# Patient Record
Sex: Female | Born: 1940 | Race: White | Hispanic: No | Smoking: Never smoker
Health system: Southern US, Community
[De-identification: ages and names within clinical notes are randomized; demographics above are authoritative.]

## PROBLEM LIST (undated history)

## (undated) DIAGNOSIS — I10 Essential (primary) hypertension: Secondary | ICD-10-CM

## (undated) DIAGNOSIS — I739 Peripheral vascular disease, unspecified: Secondary | ICD-10-CM

## (undated) DIAGNOSIS — I2510 Atherosclerotic heart disease of native coronary artery without angina pectoris: Secondary | ICD-10-CM

## (undated) DIAGNOSIS — K759 Inflammatory liver disease, unspecified: Secondary | ICD-10-CM

## (undated) DIAGNOSIS — E785 Hyperlipidemia, unspecified: Secondary | ICD-10-CM

## (undated) DIAGNOSIS — N189 Chronic kidney disease, unspecified: Secondary | ICD-10-CM

## (undated) DIAGNOSIS — M1990 Unspecified osteoarthritis, unspecified site: Secondary | ICD-10-CM

## (undated) DIAGNOSIS — I81 Portal vein thrombosis: Secondary | ICD-10-CM

## (undated) DIAGNOSIS — R319 Hematuria, unspecified: Secondary | ICD-10-CM

## (undated) DIAGNOSIS — N200 Calculus of kidney: Secondary | ICD-10-CM

## (undated) DIAGNOSIS — K219 Gastro-esophageal reflux disease without esophagitis: Secondary | ICD-10-CM

## (undated) DIAGNOSIS — N952 Postmenopausal atrophic vaginitis: Secondary | ICD-10-CM

## (undated) HISTORY — DX: Calculus of kidney: N20.0

## (undated) HISTORY — PX: HEEL SPUR SURGERY: SHX665

## (undated) HISTORY — PX: LIP REPAIR: SHX440

## (undated) HISTORY — DX: Gastro-esophageal reflux disease without esophagitis: K21.9

## (undated) HISTORY — DX: Portal vein thrombosis: I81

## (undated) HISTORY — DX: Hematuria, unspecified: R31.9

## (undated) HISTORY — DX: Postmenopausal atrophic vaginitis: N95.2

## (undated) HISTORY — PX: TONSILLECTOMY: SUR1361

## (undated) HISTORY — PX: CERVICAL SPINE SURGERY: SHX589

---

## 2004-12-27 ENCOUNTER — Ambulatory Visit: Payer: Self-pay | Admitting: Internal Medicine

## 2005-01-02 ENCOUNTER — Ambulatory Visit: Payer: Self-pay | Admitting: Internal Medicine

## 2005-08-05 ENCOUNTER — Emergency Department: Payer: Self-pay | Admitting: Emergency Medicine

## 2005-08-10 ENCOUNTER — Inpatient Hospital Stay: Payer: Self-pay | Admitting: Internal Medicine

## 2005-08-10 ENCOUNTER — Ambulatory Visit: Payer: Self-pay | Admitting: Internal Medicine

## 2005-09-04 ENCOUNTER — Ambulatory Visit: Payer: Self-pay | Admitting: Internal Medicine

## 2006-01-16 ENCOUNTER — Other Ambulatory Visit: Payer: Self-pay

## 2006-01-16 ENCOUNTER — Emergency Department: Payer: Self-pay | Admitting: Emergency Medicine

## 2006-01-17 ENCOUNTER — Ambulatory Visit: Payer: Self-pay | Admitting: Emergency Medicine

## 2006-01-29 ENCOUNTER — Ambulatory Visit: Payer: Self-pay | Admitting: Internal Medicine

## 2006-01-30 ENCOUNTER — Ambulatory Visit: Payer: Self-pay | Admitting: Internal Medicine

## 2006-02-08 ENCOUNTER — Ambulatory Visit: Payer: Self-pay

## 2006-02-12 ENCOUNTER — Ambulatory Visit: Payer: Self-pay

## 2007-01-08 ENCOUNTER — Ambulatory Visit: Payer: Self-pay | Admitting: Internal Medicine

## 2007-01-30 ENCOUNTER — Ambulatory Visit: Payer: Self-pay | Admitting: Internal Medicine

## 2007-02-04 ENCOUNTER — Ambulatory Visit: Payer: Self-pay | Admitting: Internal Medicine

## 2008-02-27 ENCOUNTER — Ambulatory Visit: Payer: Self-pay | Admitting: Internal Medicine

## 2008-04-13 ENCOUNTER — Ambulatory Visit: Payer: Self-pay | Admitting: Internal Medicine

## 2009-01-13 ENCOUNTER — Ambulatory Visit: Payer: Self-pay | Admitting: Internal Medicine

## 2009-01-26 ENCOUNTER — Ambulatory Visit: Payer: Self-pay | Admitting: Ophthalmology

## 2009-02-28 ENCOUNTER — Ambulatory Visit: Payer: Self-pay | Admitting: Internal Medicine

## 2009-06-10 ENCOUNTER — Ambulatory Visit: Payer: Self-pay | Admitting: Neurology

## 2010-03-01 ENCOUNTER — Ambulatory Visit: Payer: Self-pay | Admitting: Internal Medicine

## 2010-10-23 ENCOUNTER — Ambulatory Visit: Payer: Self-pay | Admitting: Internal Medicine

## 2011-03-05 ENCOUNTER — Ambulatory Visit: Payer: Self-pay | Admitting: Internal Medicine

## 2011-05-23 ENCOUNTER — Encounter: Payer: Self-pay | Admitting: Unknown Physician Specialty

## 2011-06-13 ENCOUNTER — Encounter: Payer: Self-pay | Admitting: Unknown Physician Specialty

## 2012-03-05 ENCOUNTER — Ambulatory Visit: Payer: Self-pay | Admitting: Internal Medicine

## 2012-03-08 ENCOUNTER — Emergency Department: Payer: Self-pay | Admitting: Internal Medicine

## 2012-03-08 LAB — URINALYSIS, COMPLETE
Bilirubin,UR: NEGATIVE
Ketone: NEGATIVE
Leukocyte Esterase: NEGATIVE
Nitrite: NEGATIVE
Ph: 9 (ref 4.5–8.0)
Protein: NEGATIVE
Specific Gravity: 1.005 (ref 1.003–1.030)
WBC UR: <1 /HPF (ref 0–5)

## 2012-03-08 LAB — CBC
HCT: 38.3 % (ref 35.0–47.0)
MCH: 32 pg (ref 26.0–34.0)
MCV: 93 fL (ref 80–100)
RBC: 4.11 10*6/uL (ref 3.80–5.20)
RDW: 13.6 % (ref 11.5–14.5)
WBC: 4 10*3/uL (ref 3.6–11.0)

## 2012-03-08 LAB — COMPREHENSIVE METABOLIC PANEL
Alkaline Phosphatase: 64 U/L (ref 50–136)
Bilirubin,Total: 0.5 mg/dL (ref 0.2–1.0)
Calcium, Total: 9.7 mg/dL (ref 8.5–10.1)
Chloride: 99 mmol/L (ref 98–107)
Creatinine: 0.93 mg/dL (ref 0.60–1.30)
EGFR (African American): >60
EGFR (Non-African Amer.): >60
Glucose: 92 mg/dL (ref 65–99)
Osmolality: 270 (ref 275–301)
Potassium: 3.8 mmol/L (ref 3.5–5.1)
SGOT(AST): 23 U/L (ref 15–37)

## 2012-03-12 ENCOUNTER — Ambulatory Visit: Payer: Self-pay | Admitting: Internal Medicine

## 2012-12-13 ENCOUNTER — Emergency Department: Payer: Self-pay | Admitting: Emergency Medicine

## 2012-12-13 LAB — URINALYSIS, COMPLETE
Bilirubin,UR: NEGATIVE
Glucose,UR: NEGATIVE mg/dL (ref 0–75)
Ketone: NEGATIVE
Protein: NEGATIVE
Specific Gravity: 1.003 (ref 1.003–1.030)
Squamous Epithelial: 1
WBC UR: 10 /HPF (ref 0–5)

## 2012-12-13 LAB — CBC
HCT: 34.4 % — ABNORMAL LOW (ref 35.0–47.0)
MCHC: 34 g/dL (ref 32.0–36.0)
MCV: 94 fL (ref 80–100)
RDW: 14 % (ref 11.5–14.5)

## 2012-12-13 LAB — BASIC METABOLIC PANEL
BUN: 14 mg/dL (ref 7–18)
Calcium, Total: 8.7 mg/dL (ref 8.5–10.1)
Creatinine: 1.19 mg/dL (ref 0.60–1.30)
EGFR (African American): 53 — ABNORMAL LOW
Osmolality: 270 (ref 275–301)

## 2012-12-15 LAB — URINE CULTURE

## 2012-12-19 ENCOUNTER — Ambulatory Visit: Payer: Self-pay | Admitting: Internal Medicine

## 2013-03-06 ENCOUNTER — Ambulatory Visit: Payer: Self-pay | Admitting: Internal Medicine

## 2013-03-18 ENCOUNTER — Ambulatory Visit: Payer: Self-pay | Admitting: Internal Medicine

## 2013-09-02 ENCOUNTER — Ambulatory Visit: Payer: Self-pay | Admitting: Internal Medicine

## 2013-10-09 ENCOUNTER — Other Ambulatory Visit: Payer: Self-pay | Admitting: Internal Medicine

## 2013-10-09 LAB — PROTIME-INR: INR: 1.5

## 2014-03-08 ENCOUNTER — Ambulatory Visit: Payer: Self-pay | Admitting: Internal Medicine

## 2014-03-15 DIAGNOSIS — Z7901 Long term (current) use of anticoagulants: Secondary | ICD-10-CM | POA: Insufficient documentation

## 2014-07-09 ENCOUNTER — Ambulatory Visit: Payer: Self-pay | Admitting: Internal Medicine

## 2014-07-29 ENCOUNTER — Ambulatory Visit: Payer: Self-pay | Admitting: Internal Medicine

## 2014-09-30 ENCOUNTER — Emergency Department: Payer: Self-pay | Admitting: Emergency Medicine

## 2014-09-30 LAB — CBC WITH DIFFERENTIAL/PLATELET
Basophil #: 0 10*3/uL (ref 0.0–0.1)
Basophil %: 0.5 %
Eosinophil #: 0.2 10*3/uL (ref 0.0–0.7)
Eosinophil %: 3.5 %
HCT: 32.8 % — ABNORMAL LOW (ref 35.0–47.0)
HGB: 11.2 g/dL — AB (ref 12.0–16.0)
Lymphocyte #: 1.3 10*3/uL (ref 1.0–3.6)
Lymphocyte %: 26 %
MCH: 32.9 pg (ref 26.0–34.0)
MCHC: 34.3 g/dL (ref 32.0–36.0)
MCV: 96 fL (ref 80–100)
Monocyte #: 0.5 x10 3/mm (ref 0.2–0.9)
Monocyte %: 9.8 %
NEUTROS ABS: 3 10*3/uL (ref 1.4–6.5)
Neutrophil %: 60.2 %
PLATELETS: 213 10*3/uL (ref 150–440)
RBC: 3.41 10*6/uL — AB (ref 3.80–5.20)
RDW: 14.1 % (ref 11.5–14.5)
WBC: 5.1 10*3/uL (ref 3.6–11.0)

## 2014-09-30 LAB — COMPREHENSIVE METABOLIC PANEL
ALK PHOS: 66 U/L
Albumin: 3.8 g/dL (ref 3.4–5.0)
Anion Gap: 8 (ref 7–16)
BUN: 15 mg/dL (ref 7–18)
Bilirubin,Total: 0.5 mg/dL (ref 0.2–1.0)
CALCIUM: 8 mg/dL — AB (ref 8.5–10.1)
CO2: 27 mmol/L (ref 21–32)
Chloride: 95 mmol/L — ABNORMAL LOW (ref 98–107)
Creatinine: 1.33 mg/dL — ABNORMAL HIGH (ref 0.60–1.30)
EGFR (African American): 50 — ABNORMAL LOW
GFR CALC NON AF AMER: 42 — AB
GLUCOSE: 97 mg/dL (ref 65–99)
Osmolality: 262 (ref 275–301)
POTASSIUM: 3.8 mmol/L (ref 3.5–5.1)
SGOT(AST): 31 U/L (ref 15–37)
SGPT (ALT): 31 U/L
Sodium: 130 mmol/L — ABNORMAL LOW (ref 136–145)
Total Protein: 6.5 g/dL (ref 6.4–8.2)

## 2014-09-30 LAB — URINALYSIS, COMPLETE
BACTERIA: NONE SEEN
Bilirubin,UR: NEGATIVE
GLUCOSE, UR: NEGATIVE mg/dL (ref 0–75)
Ketone: NEGATIVE
NITRITE: NEGATIVE
Ph: 6 (ref 4.5–8.0)
Protein: NEGATIVE
RBC,UR: 1 /HPF (ref 0–5)
Specific Gravity: 1.003 (ref 1.003–1.030)
WBC UR: 17 /HPF (ref 0–5)

## 2014-09-30 LAB — PROTIME-INR
INR: 2
Prothrombin Time: 22.6 secs — ABNORMAL HIGH (ref 11.5–14.7)

## 2014-09-30 LAB — TROPONIN I

## 2014-10-01 ENCOUNTER — Emergency Department: Payer: Self-pay | Admitting: Emergency Medicine

## 2014-10-01 LAB — COMPREHENSIVE METABOLIC PANEL
ALK PHOS: 70 U/L
Albumin: 3.9 g/dL (ref 3.4–5.0)
Anion Gap: 9 (ref 7–16)
BUN: 13 mg/dL (ref 7–18)
Bilirubin,Total: 1.3 mg/dL — ABNORMAL HIGH (ref 0.2–1.0)
Calcium, Total: 8.5 mg/dL (ref 8.5–10.1)
Chloride: 104 mmol/L (ref 98–107)
Co2: 24 mmol/L (ref 21–32)
Creatinine: 1.22 mg/dL (ref 0.60–1.30)
GFR CALC AF AMER: 56 — AB
GFR CALC NON AF AMER: 46 — AB
Glucose: 93 mg/dL (ref 65–99)
Osmolality: 274 (ref 275–301)
POTASSIUM: 3.6 mmol/L (ref 3.5–5.1)
SGOT(AST): 29 U/L (ref 15–37)
SGPT (ALT): 30 U/L
Sodium: 137 mmol/L (ref 136–145)
Total Protein: 6.8 g/dL (ref 6.4–8.2)

## 2014-10-01 LAB — URINALYSIS, COMPLETE
BACTERIA: NONE SEEN
BILIRUBIN, UR: NEGATIVE
Glucose,UR: NEGATIVE mg/dL (ref 0–75)
KETONE: NEGATIVE
Nitrite: NEGATIVE
PH: 8 (ref 4.5–8.0)
PROTEIN: NEGATIVE
RBC,UR: 3 /HPF (ref 0–5)
Specific Gravity: 1.003 (ref 1.003–1.030)
Squamous Epithelial: 1

## 2014-10-01 LAB — CBC
HCT: 35.1 % (ref 35.0–47.0)
HGB: 11.8 g/dL — ABNORMAL LOW (ref 12.0–16.0)
MCH: 32.4 pg (ref 26.0–34.0)
MCHC: 33.6 g/dL (ref 32.0–36.0)
MCV: 97 fL (ref 80–100)
PLATELETS: 214 10*3/uL (ref 150–440)
RBC: 3.63 10*6/uL — ABNORMAL LOW (ref 3.80–5.20)
RDW: 14.1 % (ref 11.5–14.5)
WBC: 3.7 10*3/uL (ref 3.6–11.0)

## 2014-10-01 LAB — LIPASE, BLOOD: Lipase: 118 U/L (ref 73–393)

## 2014-11-24 ENCOUNTER — Ambulatory Visit: Payer: Self-pay | Admitting: Urology

## 2014-12-17 DIAGNOSIS — I10 Essential (primary) hypertension: Secondary | ICD-10-CM | POA: Insufficient documentation

## 2014-12-17 DIAGNOSIS — R079 Chest pain, unspecified: Secondary | ICD-10-CM | POA: Insufficient documentation

## 2014-12-17 DIAGNOSIS — R0681 Apnea, not elsewhere classified: Secondary | ICD-10-CM | POA: Insufficient documentation

## 2014-12-19 DIAGNOSIS — I6529 Occlusion and stenosis of unspecified carotid artery: Secondary | ICD-10-CM | POA: Insufficient documentation

## 2014-12-19 DIAGNOSIS — I2510 Atherosclerotic heart disease of native coronary artery without angina pectoris: Secondary | ICD-10-CM | POA: Insufficient documentation

## 2015-02-25 ENCOUNTER — Other Ambulatory Visit: Payer: Self-pay | Admitting: Internal Medicine

## 2015-02-25 DIAGNOSIS — Z139 Encounter for screening, unspecified: Secondary | ICD-10-CM

## 2015-03-15 ENCOUNTER — Other Ambulatory Visit: Payer: Self-pay | Admitting: Internal Medicine

## 2015-03-15 ENCOUNTER — Ambulatory Visit
Admission: RE | Admit: 2015-03-15 | Discharge: 2015-03-15 | Disposition: A | Discharge status: Home / Self Care | Payer: Medicare Other | Source: Ambulatory Visit | Attending: Internal Medicine | Admitting: Internal Medicine

## 2015-03-15 DIAGNOSIS — Z139 Encounter for screening, unspecified: Secondary | ICD-10-CM

## 2015-03-15 DIAGNOSIS — Z1231 Encounter for screening mammogram for malignant neoplasm of breast: Secondary | ICD-10-CM | POA: Diagnosis not present

## 2015-06-27 ENCOUNTER — Other Ambulatory Visit: Payer: Self-pay

## 2015-06-27 DIAGNOSIS — R310 Gross hematuria: Secondary | ICD-10-CM

## 2015-10-11 ENCOUNTER — Encounter: Payer: Self-pay | Admitting: *Deleted

## 2015-10-12 ENCOUNTER — Encounter: Payer: Self-pay | Admitting: *Deleted

## 2015-10-12 ENCOUNTER — Ambulatory Visit
Admission: RE | Admit: 2015-10-12 | Discharge: 2015-10-12 | Disposition: A | Discharge status: Home / Self Care | Payer: Medicare Other | Source: Ambulatory Visit | Attending: Unknown Physician Specialty | Admitting: Unknown Physician Specialty

## 2015-10-12 ENCOUNTER — Ambulatory Visit: Payer: Medicare Other | Admitting: Anesthesiology

## 2015-10-12 ENCOUNTER — Encounter
Admission: RE | Disposition: A | Discharge status: Home / Self Care | Payer: Self-pay | Source: Ambulatory Visit | Attending: Unknown Physician Specialty

## 2015-10-12 DIAGNOSIS — Z79899 Other long term (current) drug therapy: Secondary | ICD-10-CM | POA: Diagnosis not present

## 2015-10-12 DIAGNOSIS — M1990 Unspecified osteoarthritis, unspecified site: Secondary | ICD-10-CM | POA: Diagnosis not present

## 2015-10-12 DIAGNOSIS — K640 First degree hemorrhoids: Secondary | ICD-10-CM | POA: Insufficient documentation

## 2015-10-12 DIAGNOSIS — Z1211 Encounter for screening for malignant neoplasm of colon: Secondary | ICD-10-CM | POA: Diagnosis not present

## 2015-10-12 DIAGNOSIS — Z880 Allergy status to penicillin: Secondary | ICD-10-CM | POA: Diagnosis not present

## 2015-10-12 DIAGNOSIS — Z7951 Long term (current) use of inhaled steroids: Secondary | ICD-10-CM | POA: Insufficient documentation

## 2015-10-12 DIAGNOSIS — I739 Peripheral vascular disease, unspecified: Secondary | ICD-10-CM | POA: Diagnosis not present

## 2015-10-12 DIAGNOSIS — Z7901 Long term (current) use of anticoagulants: Secondary | ICD-10-CM | POA: Insufficient documentation

## 2015-10-12 DIAGNOSIS — I1310 Hypertensive heart and chronic kidney disease without heart failure, with stage 1 through stage 4 chronic kidney disease, or unspecified chronic kidney disease: Secondary | ICD-10-CM | POA: Insufficient documentation

## 2015-10-12 DIAGNOSIS — Z888 Allergy status to other drugs, medicaments and biological substances status: Secondary | ICD-10-CM | POA: Diagnosis not present

## 2015-10-12 DIAGNOSIS — E785 Hyperlipidemia, unspecified: Secondary | ICD-10-CM | POA: Diagnosis not present

## 2015-10-12 DIAGNOSIS — I2510 Atherosclerotic heart disease of native coronary artery without angina pectoris: Secondary | ICD-10-CM | POA: Insufficient documentation

## 2015-10-12 DIAGNOSIS — N189 Chronic kidney disease, unspecified: Secondary | ICD-10-CM | POA: Insufficient documentation

## 2015-10-12 DIAGNOSIS — Z9889 Other specified postprocedural states: Secondary | ICD-10-CM | POA: Diagnosis not present

## 2015-10-12 HISTORY — DX: Chronic kidney disease, unspecified: N18.9

## 2015-10-12 HISTORY — DX: Atherosclerotic heart disease of native coronary artery without angina pectoris: I25.10

## 2015-10-12 HISTORY — PX: COLONOSCOPY WITH PROPOFOL: SHX5780

## 2015-10-12 HISTORY — DX: Hyperlipidemia, unspecified: E78.5

## 2015-10-12 HISTORY — DX: Peripheral vascular disease, unspecified: I73.9

## 2015-10-12 HISTORY — DX: Inflammatory liver disease, unspecified: K75.9

## 2015-10-12 HISTORY — DX: Unspecified osteoarthritis, unspecified site: M19.90

## 2015-10-12 HISTORY — DX: Essential (primary) hypertension: I10

## 2015-10-12 SURGERY — COLONOSCOPY WITH PROPOFOL
Anesthesia: General

## 2015-10-12 MED ORDER — PROPOFOL 10 MG/ML IV BOLUS
INTRAVENOUS | Status: DC | PRN
  Administered 2015-10-12: 50 mg via INTRAVENOUS

## 2015-10-12 MED ORDER — FENTANYL CITRATE (PF) 100 MCG/2ML IJ SOLN
INTRAMUSCULAR | Status: DC | PRN
  Administered 2015-10-12: 50 ug via INTRAVENOUS

## 2015-10-12 MED ORDER — PROPOFOL 500 MG/50ML IV EMUL
INTRAVENOUS | Status: DC | PRN
  Administered 2015-10-12: 140 ug/kg/min via INTRAVENOUS

## 2015-10-12 MED ORDER — SODIUM CHLORIDE 0.9 % IV SOLN
INTRAVENOUS | Status: DC
Start: 2015-10-12 — End: 2015-10-12
  Administered 2015-10-12: 15:00:00 via INTRAVENOUS

## 2015-10-12 MED ORDER — MIDAZOLAM HCL 5 MG/5ML IJ SOLN
INTRAMUSCULAR | Status: DC | PRN
  Administered 2015-10-12: 1 mg via INTRAVENOUS

## 2015-10-12 MED ORDER — SODIUM CHLORIDE 0.9 % IV SOLN: INTRAVENOUS | Status: DC

## 2015-10-12 NOTE — Anesthesia Preprocedure Evaluation (Signed)
Anesthesia Evaluation  Patient identified by MRN, date of birth, ID band Patient awake    Reviewed: Allergy & Precautions, NPO status , Patient's Chart, lab work & pertinent test results, reviewed documented beta blocker date and time   Airway Mallampati: II  TM Distance: >3 FB     Dental  (+) Chipped   Pulmonary neg pulmonary ROS,    Pulmonary exam normal breath sounds clear to auscultation       Cardiovascular hypertension, Pt. on medications and Pt. on home beta blockers + CAD and + Peripheral Vascular Disease  Normal cardiovascular exam     Neuro/Psych negative neurological ROS  negative psych ROS   GI/Hepatic GERD  Medicated and Controlled,(+) Hepatitis -  Endo/Other  negative endocrine ROS  Renal/GU Renal InsufficiencyRenal disease  negative genitourinary   Musculoskeletal  (+) Arthritis , Osteoarthritis,    Abdominal   Peds negative pediatric ROS (+)  Hematology negative hematology ROS (+)   Anesthesia Other Findings   Reproductive/Obstetrics                             Anesthesia Physical Anesthesia Plan  ASA: III  Anesthesia Plan: General   Post-op Pain Management:    Induction: Intravenous  Airway Management Planned: Nasal Cannula  Additional Equipment:   Intra-op Plan:   Post-operative Plan:   Informed Consent: I have reviewed the patients History and Physical, chart, labs and discussed the procedure including the risks, benefits and alternatives for the proposed anesthesia with the patient or authorized representative who has indicated his/her understanding and acceptance.   Dental advisory given  Plan Discussed with: CRNA and Surgeon  Anesthesia Plan Comments:         Anesthesia Quick Evaluation

## 2015-10-12 NOTE — Anesthesia Postprocedure Evaluation (Incomplete)
Anesthesia Post Note  Patient: Mary Buckley  Procedure(s) Performed: Procedure(s) (LRB): COLONOSCOPY WITH PROPOFOL (N/A)  Anesthesia Post Evaluation  Last Vitals:  Filed Vitals:   10/12/15 1603 10/12/15 1604  BP: 97/51   Pulse: 68   Temp: 36 C 36 C  Resp: 15     Last Pain:  Filed Vitals:   10/12/15 1604  PainSc: 4                  Ashla Murph, RON

## 2015-10-12 NOTE — Anesthesia Procedure Notes (Signed)
Date/Time: 10/12/2015 3:33 PM Performed by: Derinda LateIACONE, Laken Lobato Pre-anesthesia Checklist: Patient being monitored, Timeout performed, Suction available, Emergency Drugs available and Patient identified Patient Re-evaluated:Patient Re-evaluated prior to inductionOxygen Delivery Method: Nasal cannula Preoxygenation: Pre-oxygenation with 100% oxygen Intubation Type: IV induction

## 2015-10-12 NOTE — H&P (Signed)
Primary Care Physician:  Marguarite ArbourSPARKS,JEFFREY D, MD Primary Gastroenterologist:  Dr. Mechele CollinElliott  Pre-Procedure History & Physical: HPI:  Mary Buckley is a 74 y.o. female is here for an colonoscopy.   Past Medical History  Diagnosis Date  . Coronary artery disease   . Hypertension   . Hyperlipidemia   . Arthritis   . Chronic kidney disease   . Peripheral vascular disease (HCC)   . Hepatitis     Past Surgical History  Procedure Laterality Date  . Tonsillectomy    . Heel spur surgery    . Lip repair N/A     Prior to Admission medications   Medication Sig Start Date End Date Taking? Authorizing Provider  acetaminophen (TYLENOL) 325 MG tablet Take 650 mg by mouth every 8 (eight) hours as needed.   Yes Historical Provider, MD  calcium carbonate (TUMS - DOSED IN MG ELEMENTAL CALCIUM) 500 MG chewable tablet Chew 2 tablets by mouth daily.   Yes Historical Provider, MD  celecoxib (CELEBREX) 200 MG capsule Take 200 mg by mouth daily.   Yes Historical Provider, MD  cetirizine (ZYRTEC) 10 MG tablet Take 10 mg by mouth daily as needed for allergies.   Yes Historical Provider, MD  cyanocobalamin 1000 MCG tablet Take 100 mcg by mouth daily.   Yes Historical Provider, MD  cyclobenzaprine (FLEXERIL) 5 MG tablet Take 5 mg by mouth 3 (three) times daily as needed for muscle spasms.   Yes Historical Provider, MD  fluticasone (FLONASE) 50 MCG/ACT nasal spray Place 2 sprays into both nostrils daily.   Yes Historical Provider, MD  loratadine (CLARITIN) 10 MG tablet Take 10 mg by mouth daily.   Yes Historical Provider, MD  nebivolol (BYSTOLIC) 5 MG tablet Take 5 mg by mouth daily.   Yes Historical Provider, MD  omega-3 acid ethyl esters (LOVAZA) 1 G capsule Take 1 g by mouth 2 (two) times daily.   Yes Historical Provider, MD  pantoprazole (PROTONIX) 40 MG tablet Take 40 mg by mouth daily.   Yes Historical Provider, MD  Propylene Glycol 0.6 % SOLN Place 1 drop into both eyes daily.   Yes Historical  Provider, MD  rosuvastatin (CRESTOR) 5 MG tablet Take 5 mg by mouth daily.   Yes Historical Provider, MD  sodium chloride (BRONCHO SALINE) inhaler solution Take 2 sprays by nebulization as needed.   Yes Historical Provider, MD  warfarin (COUMADIN) 2 MG tablet Take 2 mg by mouth daily.   Yes Historical Provider, MD    Allergies as of 09/20/2015 - Review Complete 03/15/2015  Allergen Reaction Noted  . Avelox [moxifloxacin hcl in nacl]  03/15/2015  . Bextra [valdecoxib]  03/15/2015  . Fosamax [alendronate sodium]  03/15/2015  . Guaifenesin & derivatives  03/15/2015  . Lodine [etodolac]  03/15/2015  . Penicillins  03/15/2015  . Prednisone  03/15/2015    History reviewed. No pertinent family history.  Social History   Social History  . Marital Status: Widowed    Spouse Name: N/A  . Number of Children: N/A  . Years of Education: N/A   Occupational History  . Not on file.   Social History Main Topics  . Smoking status: Never Smoker   . Smokeless tobacco: Not on file  . Alcohol Use: No  . Drug Use: No  . Sexual Activity: Not on file   Other Topics Concern  . Not on file   Social History Narrative    Review of Systems: See HPI, otherwise negative ROS  Physical Exam: BP 157/80 mmHg  Pulse 72  Temp(Src) 98.2 F (36.8 C) (Oral)  Resp 20  Ht  (1.626 m)  Wt 68.947 kg (152 lb)  BMI 26.08 kg/m2  SpO2 100% General:   Alert,  pleasant and cooperative in NAD Head:  Normocephalic and atraumatic. Neck:  Supple; no masses or thyromegaly. Lungs:  Clear throughout to auscultation.    Heart:  Regular rate and rhythm. Abdomen:  Soft, nontender and nondistended. Normal bowel sounds, without guarding, and without rebound.   Neurologic:  Alert and  oriented x4;  grossly normal neurologically.  Impression/Plan: Mary Buckley is here for an colonoscopy to be performed for screening  Risks, benefits, limitations, and alternatives regarding  colonoscopy have been reviewed  with the patient.  Questions have been answered.  All parties agreeable.   Lynnae Prude, MD  10/12/2015, 3:32 PM

## 2015-10-12 NOTE — Op Note (Signed)
[facility] Patient Name: Mary Buckley Procedure Date: 10/12/2015 3:34 PM MRN: [id number] Account #: [id number] Date of Birth: 1941/05/02 Admit Type: Outpatient Age: 74 Room: [facility] ENDO ROOM 3 Gender: Female Note Status: Finalized Procedure:         Colonoscopy Indications:       Screening for colorectal malignant neoplasm Providers:         Scot Jun, MD Referring MD:      Duane Lope. Judithann Sheen, MD (Referring MD) Medicines:         Propofol per Anesthesia Complications:     No immediate complications. Procedure:         Pre-Anesthesia Assessment:                    - After reviewing the risks and benefits, the patient was                     deemed in satisfactory condition to undergo the procedure.                    After obtaining informed consent, the colonoscope was                     passed under direct vision. Throughout the procedure, the                     patient's blood pressure, pulse, and oxygen saturations                     were monitored continuously. The Colonoscope was                     introduced through the anus and advanced to the the cecum,                     identified by appendiceal orifice and ileocecal valve. The                     colonoscopy was somewhat difficult due to a tortuous                     colon. Successful completion of the procedure was aided by                     applying abdominal pressure. The patient tolerated the                     procedure well. The quality of the bowel preparation was                     excellent. Findings:      Internal hemorrhoids were found during endoscopy. The hemorrhoids were       small and Grade I (internal hemorrhoids that do not prolapse).      The exam was otherwise without abnormality. Impression:        - Internal hemorrhoids.                    - The examination was otherwise normal.                    - No specimens collected. Recommendation:    -  The findings and recommendations were discussed with the  patient's family. Due to age and normal colon exam I do                     not recommend a repeat colonoscopy. Scot Junobert T Elliott, MD 10/12/2015 3:59:32 PM This report has been signed electronically. Number of Addenda: 0 Note Initiated On: 10/12/2015 3:34 PM Scope Withdrawal Time: 0 hours 7 minutes 40 seconds  Total Procedure Duration: 0 hours 19 minutes 36 seconds       [facility]

## 2015-10-12 NOTE — Transfer of Care (Signed)
Immediate Anesthesia Transfer of Care Note  Patient: Mary ForgeRuth Boone Campton  Procedure(s) Performed: Procedure(s): COLONOSCOPY WITH PROPOFOL (N/A)  Patient Location: PACU and Endoscopy Unit  Anesthesia Type:General  Level of Consciousness: awake, alert  and oriented  Airway & Oxygen Therapy: Patient Spontanous Breathing and Patient connected to nasal cannula oxygen  Post-op Assessment: Report given to RN and Post -op Vital signs reviewed and stable  Post vital signs: Reviewed and stable  Last Vitals:  Filed Vitals:   10/12/15 1603 10/12/15 1604  BP: 97/51   Pulse: 68   Temp: 36 C 36 C  Resp: 15     Complications: No apparent anesthesia complications

## 2015-10-12 NOTE — Anesthesia Postprocedure Evaluation (Signed)
Anesthesia Post Note  Patient: Mary Buckley  Procedure(s) Performed: Procedure(s) (LRB): COLONOSCOPY WITH PROPOFOL (N/A)  Patient location during evaluation: PACU Anesthesia Type: General Level of consciousness: awake and alert Pain management: pain level controlled Vital Signs Assessment: post-procedure vital signs reviewed and stable Respiratory status: spontaneous breathing, nonlabored ventilation, respiratory function stable and patient connected to nasal cannula oxygen Cardiovascular status: blood pressure returned to baseline and stable Postop Assessment: no signs of nausea or vomiting Anesthetic complications: no    Last Vitals:  Filed Vitals:   10/12/15 1623 10/12/15 1633  BP:  132/68  Pulse: 68 64  Temp:    Resp: 18 13    Last Pain:  Filed Vitals:   10/12/15 1636  PainSc: 4                  Cleda MccreedyJoseph K Hillman Attig

## 2015-10-17 ENCOUNTER — Encounter: Payer: Self-pay | Admitting: Unknown Physician Specialty

## 2015-11-28 ENCOUNTER — Telehealth: Payer: Self-pay | Admitting: Urology

## 2015-11-28 ENCOUNTER — Other Ambulatory Visit: Payer: Self-pay | Admitting: Urology

## 2015-11-28 DIAGNOSIS — N281 Cyst of kidney, acquired: Secondary | ICD-10-CM

## 2015-11-28 NOTE — Telephone Encounter (Signed)
Patient has a one-year follow up (02/23/16)for microhematuria with a renal ultrasound prior to appointment.  Please place order for renal ultrasound.

## 2015-11-28 NOTE — Telephone Encounter (Signed)
Computer states there is an order placed by Dr. Apolinar JunesBrandon.

## 2015-12-22 ENCOUNTER — Ambulatory Visit
Admission: RE | Admit: 2015-12-22 | Discharge: 2015-12-22 | Disposition: A | Discharge status: Home / Self Care | Payer: Medicare Other | Source: Ambulatory Visit | Attending: Urology | Admitting: Urology

## 2015-12-22 DIAGNOSIS — R310 Gross hematuria: Secondary | ICD-10-CM | POA: Insufficient documentation

## 2015-12-22 DIAGNOSIS — R3129 Other microscopic hematuria: Secondary | ICD-10-CM | POA: Diagnosis not present

## 2015-12-26 ENCOUNTER — Telehealth: Payer: Self-pay

## 2015-12-26 NOTE — Telephone Encounter (Signed)
Spoke with pt in reference to renal u/s results. Pt voiced understanding.

## 2015-12-26 NOTE — Telephone Encounter (Signed)
-----   Message from Vanna Scotland, MD sent at 12/25/2015 11:53 AM EST ----- Please let this patient know her renal ultrasound was unremarkable.  She should follow up with [facility] as scheduled.    Vanna Scotland, MD

## 2016-01-11 ENCOUNTER — Other Ambulatory Visit: Payer: Self-pay | Admitting: Internal Medicine

## 2016-01-11 DIAGNOSIS — Z1231 Encounter for screening mammogram for malignant neoplasm of breast: Secondary | ICD-10-CM

## 2016-02-22 ENCOUNTER — Encounter: Payer: Self-pay | Admitting: *Deleted

## 2016-02-23 ENCOUNTER — Encounter: Payer: Self-pay | Admitting: Urology

## 2016-02-23 ENCOUNTER — Ambulatory Visit (INDEPENDENT_AMBULATORY_CARE_PROVIDER_SITE_OTHER): Payer: Medicare Other | Admitting: Urology

## 2016-02-23 VITALS — BP 147/82 | HR 76 | Ht 64.0 in | Wt 157.0 lb

## 2016-02-23 DIAGNOSIS — R3129 Other microscopic hematuria: Secondary | ICD-10-CM

## 2016-02-23 DIAGNOSIS — E782 Mixed hyperlipidemia: Secondary | ICD-10-CM | POA: Insufficient documentation

## 2016-02-23 DIAGNOSIS — I81 Portal vein thrombosis: Secondary | ICD-10-CM | POA: Insufficient documentation

## 2016-02-23 DIAGNOSIS — Z9109 Other allergy status, other than to drugs and biological substances: Secondary | ICD-10-CM | POA: Insufficient documentation

## 2016-02-23 DIAGNOSIS — M5137 Other intervertebral disc degeneration, lumbosacral region: Secondary | ICD-10-CM | POA: Insufficient documentation

## 2016-02-23 DIAGNOSIS — M810 Age-related osteoporosis without current pathological fracture: Secondary | ICD-10-CM | POA: Insufficient documentation

## 2016-02-23 DIAGNOSIS — K219 Gastro-esophageal reflux disease without esophagitis: Secondary | ICD-10-CM | POA: Insufficient documentation

## 2016-02-23 LAB — URINALYSIS, COMPLETE
BILIRUBIN UA: NEGATIVE
Glucose, UA: NEGATIVE
KETONES UA: NEGATIVE
Nitrite, UA: NEGATIVE
PROTEIN UA: NEGATIVE
SPEC GRAV UA: 1.01 (ref 1.005–1.030)
Urobilinogen, Ur: 0.2 mg/dL (ref 0.2–1.0)
pH, UA: 5.5 (ref 5.0–7.5)

## 2016-02-23 LAB — MICROSCOPIC EXAMINATION

## 2016-02-23 NOTE — Progress Notes (Signed)
02/23/2016 9:45 AM   Mary Buckley [birth year] [id number]  Referring provider: Marguarite ArbourJeffrey D Sparks, MD [address] [facility] [place], [place] [id number]  Chief Complaint  Patient presents with  . Hematuria    1year    HPI: The patient's a 75 year old female presents for follow-up of microscopic hematuria. She underwent a formal hematuria workup which was completed in April 2016. It was grossly negative though she had poor opacifications of the right greater than left distal ureter. She also had a very small left renal lesion is too small to characterize but was most likely a renal cyst. She will follow-up renal ultrasound in February 2017 which was normal with no evidence of tumor. She has persistent microscopic hematuria today. She's had no complaints of the last year. Denies any urinary tract infections last 12 months. She denies any other changes in her urinary status. She denies gross hematuria.   PMH: Past Medical History  Diagnosis Date  . Coronary artery disease   . Hypertension   . Hyperlipidemia   . Arthritis   . Chronic kidney disease   . Peripheral vascular disease (HCC)   . Hepatitis   . Portal vein thrombosis   . Kidney stone   . GERD (gastroesophageal reflux disease)   . Hematuria     microscopic  . Atrophic vaginitis     Surgical History: Past Surgical History  Procedure Laterality Date  . Tonsillectomy    . Heel spur surgery    . Lip repair N/A   . Colonoscopy with propofol N/A 10/12/2015    Procedure: COLONOSCOPY WITH PROPOFOL;  Surgeon: Scot Junobert T Elliott, MD;  [facility];  Service: Endoscopy;  Laterality: N/A;  . Cervical spine surgery      ruptured disc    Home Medications:    Medication List       This list is accurate as of: 02/23/16  9:45 AM.  Always use your most recent med list.               acetaminophen 325 MG tablet  Commonly known as:  TYLENOL  Take 650 mg by mouth every 8 (eight) hours as needed.      calcium carbonate 500 MG chewable tablet  Commonly known as:  TUMS - dosed in mg elemental calcium  Chew 2 tablets by mouth daily.     celecoxib 200 MG capsule  Commonly known as:  CELEBREX  Take 200 mg by mouth daily.     cyanocobalamin 1000 MCG tablet  Take 100 mcg by mouth daily.     cyclobenzaprine 5 MG tablet  Commonly known as:  FLEXERIL  Take 5 mg by mouth 3 (three) times daily as needed for muscle spasms.     fluticasone 50 MCG/ACT nasal spray  Commonly known as:  FLONASE  Place 2 sprays into both nostrils daily.     loratadine 10 MG tablet  Commonly known as:  CLARITIN  Take 10 mg by mouth daily.     nebivolol 5 MG tablet  Commonly known as:  BYSTOLIC  Take 5 mg by mouth daily.     omega-3 acid ethyl esters 1 g capsule  Commonly known as:  LOVAZA  Take 1 g by mouth 2 (two) times daily.     pantoprazole 40 MG tablet  Commonly known as:  PROTONIX  Take 40 mg by mouth daily.     Propylene Glycol 0.6 % Soln  Place 1 drop into both eyes daily.  rosuvastatin 5 MG tablet  Commonly known as:  CRESTOR  Take 5 mg by mouth daily.     sodium chloride inhaler solution  Commonly known as:  BRONCHO SALINE  Take 2 sprays by nebulization as needed.     warfarin 2 MG tablet  Commonly known as:  COUMADIN  Take 2 mg by mouth daily.        Allergies:  Allergies  Allergen Reactions  . Avelox [Moxifloxacin Hcl In Nacl]   . Bextra [Valdecoxib]   . Chlorzoxazone   . Fosamax [Alendronate Sodium]   . Guaifenesin & Derivatives   . Lodine [Etodolac]   . Methocarbamol Other (See Comments)    GI upset  . Penicillins   . Prednisone     Other reaction(s): Other (See Comments) flushing    Family History: Family History  Problem Relation Age of Onset  . Arrhythmia Mother     pacemaker placed  . Transient ischemic attack Father   . Prostate cancer Brother   . Bladder Cancer Neg Hx   . Kidney cancer Neg Hx     Social History:  reports that she has never  smoked. She does not have any smokeless tobacco history on file. She reports that she does not drink alcohol or use illicit drugs.  ROS: UROLOGY Frequent Urination?: No Hard to postpone urination?: No Burning/pain with urination?: No Get up at night to urinate?: Yes Leakage of urine?: No Urine stream starts and stops?: No Trouble starting stream?: No Do you have to strain to urinate?: No Blood in urine?: No Urinary tract infection?: No Sexually transmitted disease?: No Injury to kidneys or bladder?: No Painful intercourse?: No Weak stream?: No Currently pregnant?: No Vaginal bleeding?: No Last menstrual period?: n  Gastrointestinal Nausea?: No Vomiting?: No Indigestion/heartburn?: No Diarrhea?: No Constipation?: Yes  Constitutional Fever: No Night sweats?: No Weight loss?: No Fatigue?: No  Skin Skin rash/lesions?: Yes Itching?: No  Eyes Blurred vision?: No Double vision?: No  Ears/Nose/Throat Sore throat?: No Sinus problems?: Yes  Hematologic/Lymphatic Swollen glands?: No Easy bruising?: No  Cardiovascular Leg swelling?: No Chest pain?: No  Respiratory Cough?: No Shortness of breath?: No  Endocrine Excessive thirst?: No  Musculoskeletal Back pain?: Yes Joint pain?: No  Neurological Headaches?: No Dizziness?: No  Psychologic Depression?: No Anxiety?: No  Physical Exam: BP 147/82 mmHg  Pulse 76  Ht  (1.626 m)  Wt 157 lb (71.215 kg)  BMI 26.94 kg/m2  Constitutional:  Alert and oriented, No acute distress. HEENT: [place] AT, moist mucus membranes.  Trachea midline, no masses. Cardiovascular: No clubbing, cyanosis, or edema. Respiratory: Normal respiratory effort, no increased work of breathing. GI: Abdomen is soft, nontender, nondistended, no abdominal masses GU: No CVA tenderness.  Skin: No rashes, bruises or suspicious lesions. Lymph: No cervical or inguinal adenopathy. Neurologic: Grossly intact, no focal deficits, moving all 4  extremities. Psychiatric: Normal mood and affect.  Laboratory Data: Lab Results  Component Value Date   WBC 3.7 10/01/2014   HGB 11.8* 10/01/2014   HCT 35.1 10/01/2014   MCV 97 10/01/2014   PLT 214 10/01/2014    Lab Results  Component Value Date   CREATININE 1.22 10/01/2014    No results found for: PSA  No results found for: TESTOSTERONE  No results found for: HGBA1C  Urinalysis    Component Value Date/Time   COLORURINE Colorless 10/01/2014 0607   APPEARANCEUR Clear 10/01/2014 0607   LABSPEC 1.003 10/01/2014 0607   PHURINE 8.0 10/01/2014 1610  GLUCOSEU Negative 10/01/2014 0607   HGBUR 2+ 10/01/2014 0607   BILIRUBINUR Negative 10/01/2014 0607   KETONESUR Negative 10/01/2014 0607   PROTEINUR Negative 10/01/2014 0607   NITRITE Negative 10/01/2014 0607   LEUKOCYTESUR Trace 10/01/2014 0607    Pertinent Imaging: CLINICAL DATA: Gross hematuria. Patient reports microscopic hematuria since last year.  EXAM: RENAL / URINARY TRACT ULTRASOUND COMPLETE  COMPARISON: 11/24/2014 CT  FINDINGS: Right Kidney:  Length: 10.2 cm. Echogenicity within normal limits. No mass or hydronephrosis visualized.  Left Kidney:  Length: 9.8 cm. Echogenicity within normal limits. No mass or hydronephrosis visualized.  Bladder:  Appears normal for degree of bladder distention.  IMPRESSION: Normal renal ultrasound.  Assessment & Plan:    1. Microscopic hematuria -Negative workup completed in April 2016. Patient sells persistent microscopic hematuria. However follow up in 1 year to monitor her urine for microhematuria. We discussed that if she has persistent microscopic hematuria for 3-5 years that we would generally would repeat her hematuria workup at that time.   Return in about 1 year (around 02/22/2017) for urinalysis.  Hildred Laser, MD  [facility] [address] [place], [place] [address] [phone]

## 2016-03-16 ENCOUNTER — Ambulatory Visit
Admission: RE | Admit: 2016-03-16 | Discharge: 2016-03-16 | Disposition: A | Discharge status: Home / Self Care | Payer: Medicare Other | Source: Ambulatory Visit | Attending: Internal Medicine | Admitting: Internal Medicine

## 2016-03-16 ENCOUNTER — Other Ambulatory Visit: Payer: Self-pay | Admitting: Internal Medicine

## 2016-03-16 DIAGNOSIS — Z1231 Encounter for screening mammogram for malignant neoplasm of breast: Secondary | ICD-10-CM | POA: Insufficient documentation

## 2016-03-20 ENCOUNTER — Other Ambulatory Visit: Payer: Self-pay | Admitting: Internal Medicine

## 2016-03-20 DIAGNOSIS — R928 Other abnormal and inconclusive findings on diagnostic imaging of breast: Secondary | ICD-10-CM

## 2016-03-30 ENCOUNTER — Ambulatory Visit
Admission: RE | Admit: 2016-03-30 | Discharge: 2016-03-30 | Disposition: A | Discharge status: Home / Self Care | Payer: Medicare Other | Source: Ambulatory Visit | Attending: Internal Medicine | Admitting: Internal Medicine

## 2016-03-30 DIAGNOSIS — R928 Other abnormal and inconclusive findings on diagnostic imaging of breast: Secondary | ICD-10-CM

## 2016-08-23 ENCOUNTER — Other Ambulatory Visit: Payer: Self-pay | Admitting: Internal Medicine

## 2016-08-23 DIAGNOSIS — M79662 Pain in left lower leg: Secondary | ICD-10-CM

## 2016-08-29 ENCOUNTER — Ambulatory Visit
Admission: RE | Admit: 2016-08-29 | Discharge: 2016-08-29 | Disposition: A | Discharge status: Home / Self Care | Payer: Medicare Other | Source: Ambulatory Visit | Attending: Internal Medicine | Admitting: Internal Medicine

## 2016-08-29 DIAGNOSIS — M79662 Pain in left lower leg: Secondary | ICD-10-CM | POA: Insufficient documentation

## 2017-01-24 ENCOUNTER — Other Ambulatory Visit: Payer: Self-pay | Admitting: Internal Medicine

## 2017-01-24 DIAGNOSIS — Z1231 Encounter for screening mammogram for malignant neoplasm of breast: Secondary | ICD-10-CM

## 2017-02-20 NOTE — Progress Notes (Signed)
02/21/2017 10:24 PM   Mary Buckley 10/22/1941 [id number]  Referring provider: Marguarite Arbour, MD [address] [facility] [place], [place] [address]  Chief Complaint  Patient presents with  . Hematuria    1 year follow up     HPI: 76 yo WF who presents today for follow up for microscopic hematuria.  She underwent a formal hematuria workup which was completed in April 2016. It was grossly negative though she had poor opacifications of the right greater than left distal ureter. She also had a very small left renal lesion is too small to characterize but was most likely a renal cyst. She will follow-up renal ultrasound in February 2017 which was normal with no evidence of tumor. She has persistent microscopic hematuria today. She's had no complaints of the last year. Denies any urinary tract infections last 12 months. She denies any other changes in her urinary status. She denies gross hematuria.  Her UA today notes 3-10 RBC's.    She is currently experiencing urgency 0-3, frequency 4-7, she is restricting fluids to limit visits to the restroom, she does engage in toilet mapping, incontinence x 0-3 and nocturia 0-3.  She is not experiencing dysuria, gross hematuria or suprapubic pain. She also denies fevers, chills, nausea or vomiting.  She is having right-sided flank pain, but she states that this has been occurring on and off for several years. Her CT urogram performed in 2016 noted Multilevel degenerative disc disease in the lumbar spine, most severe at T12-L1 and L4-L5.   PMH: Past Medical History:  Diagnosis Date  . Arthritis   . Atrophic vaginitis   . Chronic kidney disease   . Coronary artery disease   . GERD (gastroesophageal reflux disease)   . Hematuria    microscopic  . Hepatitis   . Hyperlipidemia   . Hypertension   . Kidney stone   . Peripheral vascular disease (HCC)   . Portal vein thrombosis     Surgical History: Past Surgical  History:  Procedure Laterality Date  . CERVICAL SPINE SURGERY     ruptured disc   patient states never any surgery for this  . COLONOSCOPY WITH PROPOFOL N/A 10/12/2015   Procedure: COLONOSCOPY WITH PROPOFOL;  Surgeon: Scot Jun, MD;  Location: [facility] ENDOSCOPY;  Service: Endoscopy;  Laterality: N/A;  . HEEL SPUR SURGERY    . LIP REPAIR N/A   . TONSILLECTOMY      Home Medications:  Allergies as of 02/21/2017      Reactions   Avelox [moxifloxacin Hcl In Nacl]    Bextra [valdecoxib]    Chlorzoxazone    Fosamax [alendronate Sodium]    Guaifenesin & Derivatives    Lodine [etodolac]    Methocarbamol Other (See Comments)   GI upset   Penicillins    Prednisone    Other reaction(s): Other (See Comments) flushing      Medication List       Accurate as of 02/21/17 10:24 PM. Always use your most recent med list.          acetaminophen 325 MG tablet Commonly known as:  TYLENOL Take 650 mg by mouth every 8 (eight) hours as needed.   calcium carbonate 500 MG chewable tablet Commonly known as:  TUMS - dosed in mg elemental calcium Chew 2 tablets by mouth daily.   CALCIUM PO Take by mouth.   celecoxib 200 MG capsule Commonly known as:  CELEBREX Take 200 mg by mouth daily.  cyanocobalamin 1000 MCG tablet Take 100 mcg by mouth daily.   cyclobenzaprine 5 MG tablet Commonly known as:  FLEXERIL Take 5 mg by mouth 3 (three) times daily as needed for muscle spasms.   FISH OIL PO Take by mouth.   fluticasone 50 MCG/ACT nasal spray Commonly known as:  FLONASE Place 2 sprays into both nostrils daily.   loratadine 10 MG tablet Commonly known as:  CLARITIN Take 10 mg by mouth daily.   nebivolol 10 MG tablet Commonly known as:  BYSTOLIC Take by mouth.   omega-3 acid ethyl esters 1 g capsule Commonly known as:  LOVAZA Take 1 g by mouth 2 (two) times daily.   pantoprazole 40 MG tablet Commonly known as:  PROTONIX Take 40 mg by mouth daily.   Propylene Glycol 0.6 %  Soln Place 1 drop into both eyes daily.   rosuvastatin 5 MG tablet Commonly known as:  CRESTOR Take 5 mg by mouth daily.   sodium chloride 0.65 % nasal spray Commonly known as:  OCEAN 2 sprays by Nasal route once daily.   sodium chloride inhaler solution Commonly known as:  BRONCHO SALINE Take 2 sprays by nebulization as needed.   warfarin 2 MG tablet Commonly known as:  COUMADIN Take 2 mg by mouth daily.       Allergies:  Allergies  Allergen Reactions  . Avelox [Moxifloxacin Hcl In Nacl]   . Bextra [Valdecoxib]   . Chlorzoxazone   . Fosamax [Alendronate Sodium]   . Guaifenesin & Derivatives   . Lodine [Etodolac]   . Methocarbamol Other (See Comments)    GI upset  . Penicillins   . Prednisone     Other reaction(s): Other (See Comments) flushing    Family History: Family History  Problem Relation Age of Onset  . Arrhythmia Mother     pacemaker placed  . Transient ischemic attack Father   . Prostate cancer Brother   . Bladder Cancer Neg Hx   . Kidney cancer Neg Hx   . Breast cancer Neg Hx     Social History:  reports that she has never smoked. She has never used smokeless tobacco. She reports that she does not drink alcohol or use drugs.  ROS: UROLOGY Frequent Urination?: No Hard to postpone urination?: No Burning/pain with urination?: No Get up at night to urinate?: Yes Leakage of urine?: No Urine stream starts and stops?: No Trouble starting stream?: No Do you have to strain to urinate?: No Blood in urine?: No Urinary tract infection?: No Sexually transmitted disease?: No Injury to kidneys or bladder?: No Painful intercourse?: No Weak stream?: No Currently pregnant?: No Vaginal bleeding?: No Last menstrual period?: n  Gastrointestinal Nausea?: No Vomiting?: No Indigestion/heartburn?: No Diarrhea?: No Constipation?: Yes  Constitutional Fever: No Night sweats?: No Weight loss?: No Fatigue?: No  Skin Skin rash/lesions?: No Itching?:  No  Eyes Blurred vision?: No Double vision?: No  Ears/Nose/Throat Sore throat?: No Sinus problems?: Yes  Hematologic/Lymphatic Swollen glands?: No Easy bruising?: Yes  Cardiovascular Leg swelling?: No Chest pain?: No  Respiratory Cough?: No Shortness of breath?: No  Endocrine Excessive thirst?: No  Musculoskeletal Back pain?: Yes Joint pain?: No  Neurological Headaches?: No Dizziness?: No  Psychologic Depression?: No Anxiety?: No  Physical Exam: BP 132/79   Pulse 76   Ht  (1.626 m)   Wt 155 lb 4.8 oz (70.4 kg)   BMI 26.66 kg/m   Constitutional:  Alert and oriented, No acute distress. HEENT: [place] AT, moist  mucus membranes.  Trachea midline, no masses. Cardiovascular: No clubbing, cyanosis, or edema. Respiratory: Normal respiratory effort, no increased work of breathing. GI: Abdomen is soft, nontender, nondistended, no abdominal masses GU: No CVA tenderness.  Skin: No rashes, bruises or suspicious lesions. Lymph: No cervical or inguinal adenopathy. Neurologic: Grossly intact, no focal deficits, moving all 4 extremities. Psychiatric: Normal mood and affect.  Laboratory Data: Lab Results  Component Value Date   WBC 3.7 10/01/2014   HGB 11.8 (L) 10/01/2014   HCT 35.1 10/01/2014   MCV 97 10/01/2014   PLT 214 10/01/2014    Lab Results  Component Value Date   CREATININE 1.22 10/01/2014    Urinalysis 3-10 RBC's.  See EPIC.   Assessment & Plan:    1. Microscopic hematuria  - Negative workup completed in April 2016. Patient sells persistent microscopic hematuria. However follow up in 1 year to monitor her urine for microhematuria.  We discussed that if she has persistent microscopic hematuria for 3-5 years that we would generally would repeat her hematuria workup at that time.  - Will report any gross hematuria  2. Frequency  - offered behavioral therapies, bladder training, bladder control strategies and pelvic floor muscle training - patient  does not find her symptoms bothersome at this time  - fluid management - patient has adequate fluid intake  - offered medical therapy with anticholinergic therapy or beta-3 adrenergic receptor agonist and the potential side effects of each therapy - patient does not find her symptoms bothersome at this time  - RTC in 1 year for OAB questionnaire  3. Right flank pain  - Most likely musculoskeletal  Return in about 1 year (around 02/21/2018) for UA and OAB questionnaire.  Michiel Cowboy, PA-C  [facility] [address], [address] [place], [place] [address] [phone]

## 2017-02-21 ENCOUNTER — Ambulatory Visit (INDEPENDENT_AMBULATORY_CARE_PROVIDER_SITE_OTHER): Payer: Medicare Other | Admitting: Urology

## 2017-02-21 ENCOUNTER — Encounter: Payer: Self-pay | Admitting: Urology

## 2017-02-21 VITALS — BP 132/79 | HR 76 | Ht 64.0 in | Wt 155.3 lb

## 2017-02-21 DIAGNOSIS — R109 Unspecified abdominal pain: Secondary | ICD-10-CM | POA: Diagnosis not present

## 2017-02-21 DIAGNOSIS — R350 Frequency of micturition: Secondary | ICD-10-CM

## 2017-02-21 DIAGNOSIS — R3129 Other microscopic hematuria: Secondary | ICD-10-CM

## 2017-02-21 LAB — URINALYSIS, COMPLETE
BILIRUBIN UA: NEGATIVE
Glucose, UA: NEGATIVE
Ketones, UA: NEGATIVE
Nitrite, UA: NEGATIVE
PH UA: 6.5 (ref 5.0–7.5)
PROTEIN UA: NEGATIVE
Specific Gravity, UA: 1.01 (ref 1.005–1.030)
Urobilinogen, Ur: 0.2 mg/dL (ref 0.2–1.0)

## 2017-02-21 LAB — MICROSCOPIC EXAMINATION

## 2017-04-01 ENCOUNTER — Ambulatory Visit
Admission: RE | Admit: 2017-04-01 | Discharge: 2017-04-01 | Disposition: A | Discharge status: Home / Self Care | Payer: Medicare Other | Source: Ambulatory Visit | Attending: Internal Medicine | Admitting: Internal Medicine

## 2017-04-01 DIAGNOSIS — Z1231 Encounter for screening mammogram for malignant neoplasm of breast: Secondary | ICD-10-CM | POA: Insufficient documentation

## 2017-08-03 IMAGING — MG MM DIGITAL SCREENING BILAT W/ TOMO W/ CAD
8 of 14 series · 8 of 30 positions shown · non-contrast
Comparison: Previous exam(s).

CLINICAL DATA: Screening.

EXAM:
2D DIGITAL SCREENING BILATERAL MAMMOGRAM WITH CAD AND ADJUNCT TOMO

[R CV]
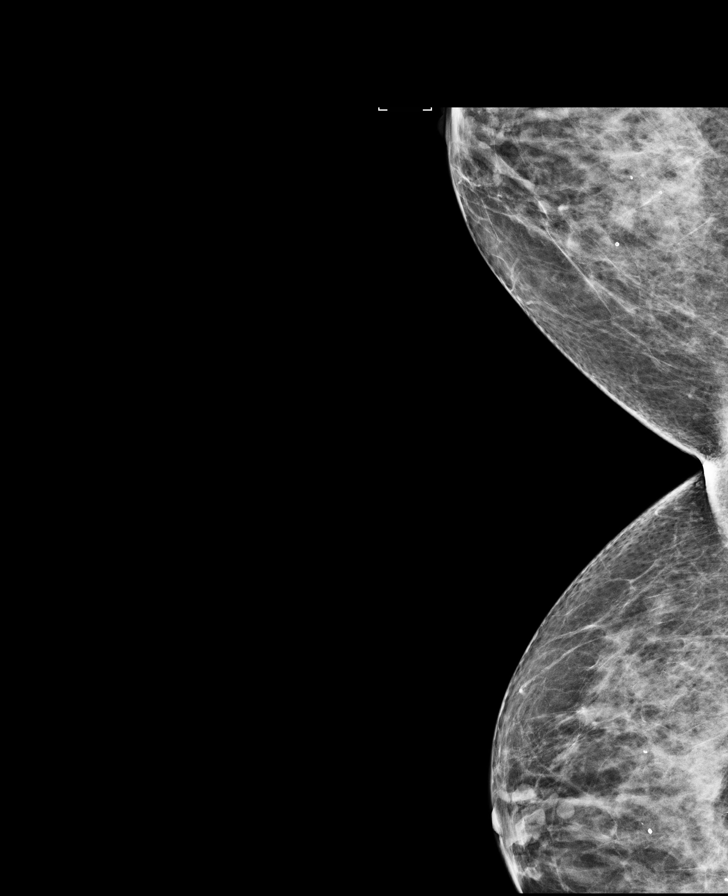

[R CC (1 of 2)]
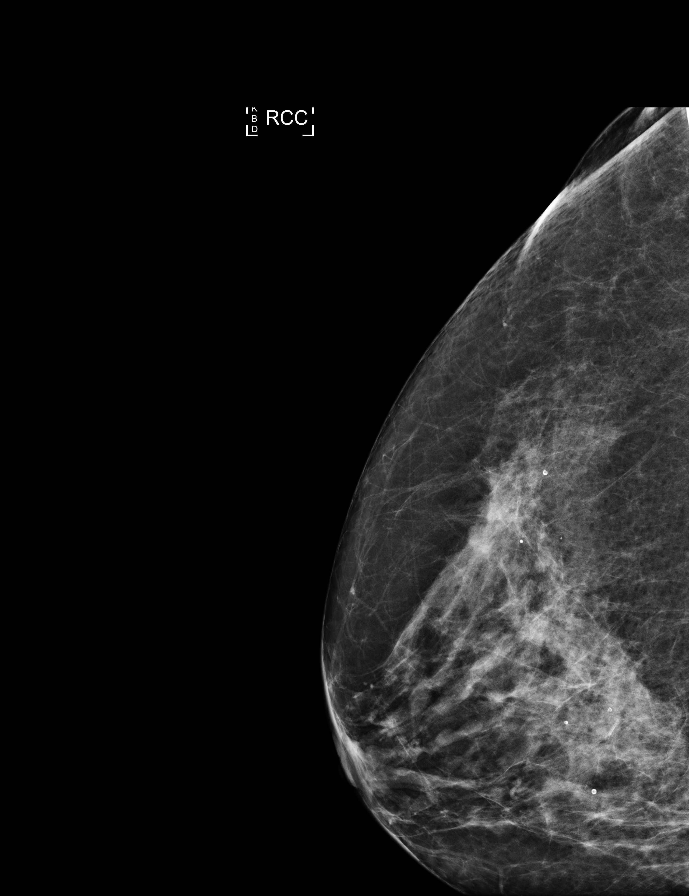

[R MLO]
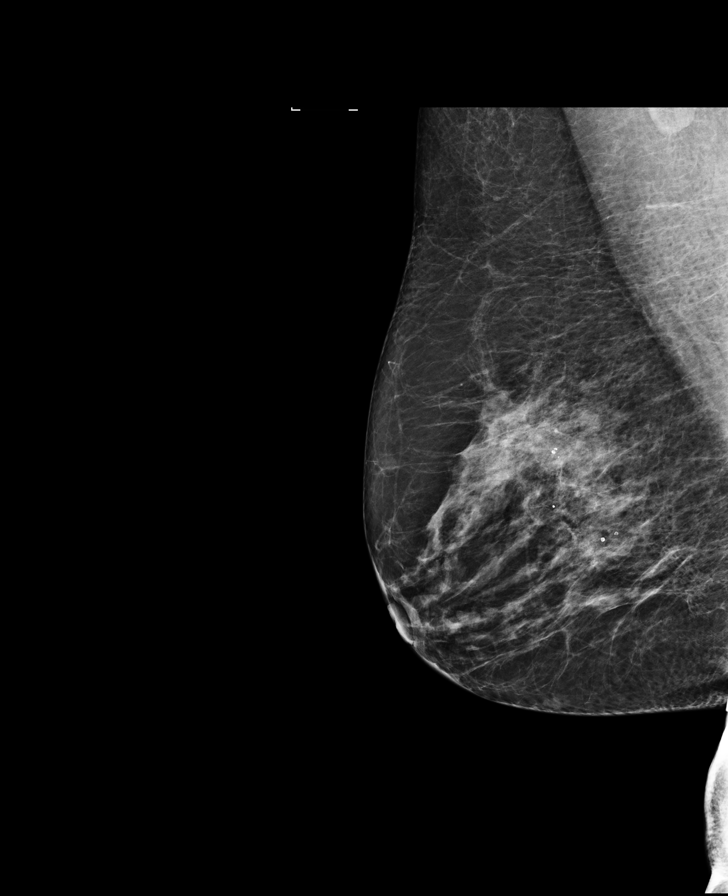

[R MLO synth-2D]
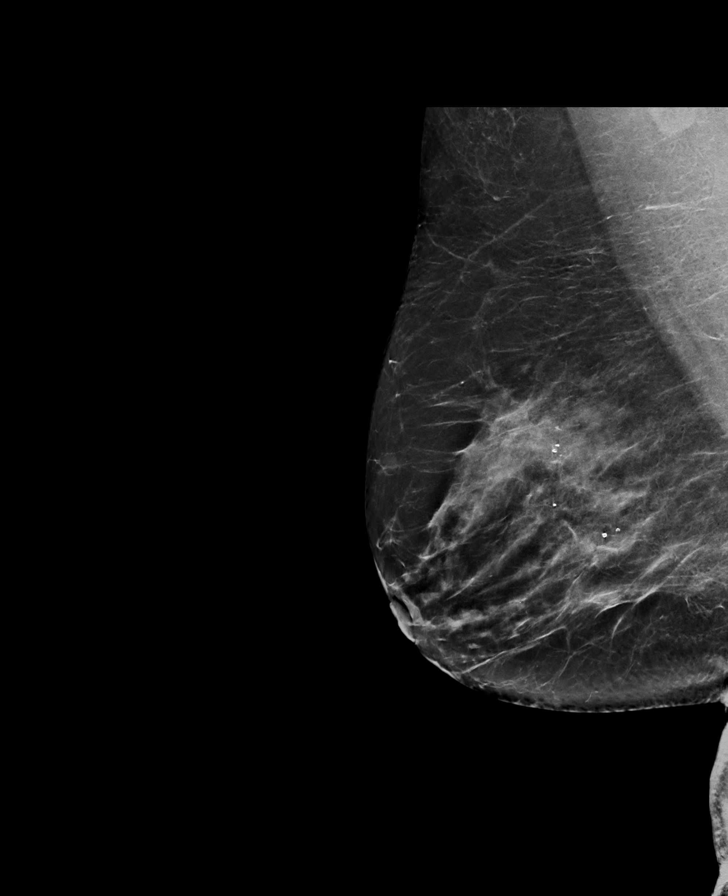

[L MLO synth-2D]
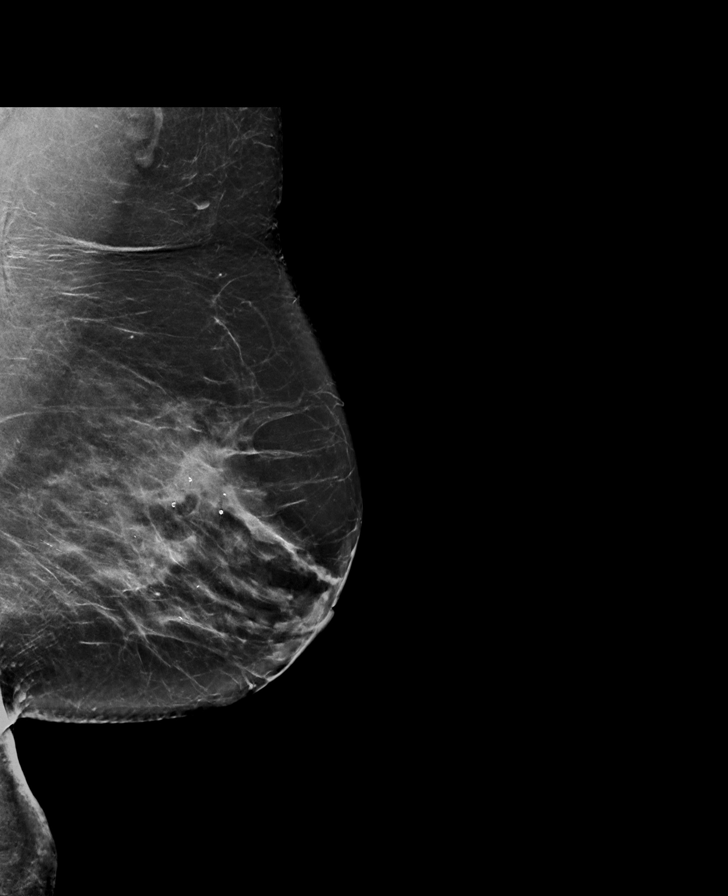

[L MLO]
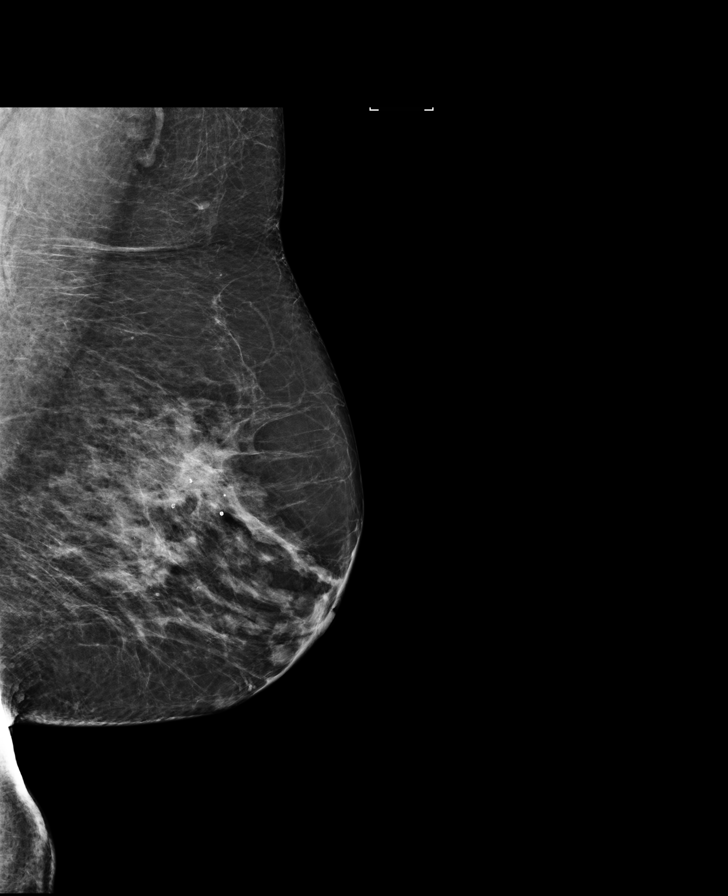

[R CC synth-2D]
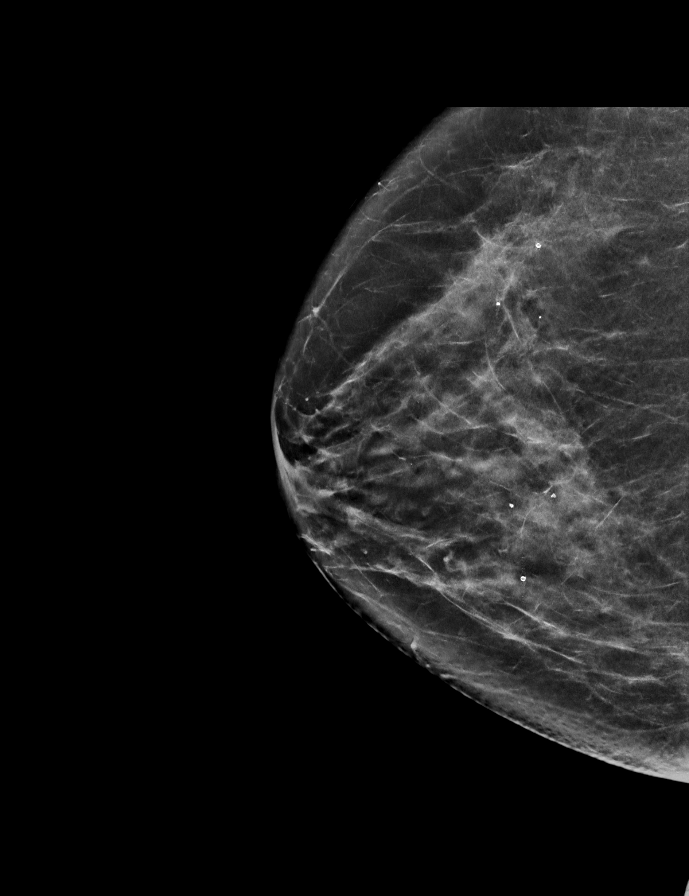

[R CC (2 of 2)]
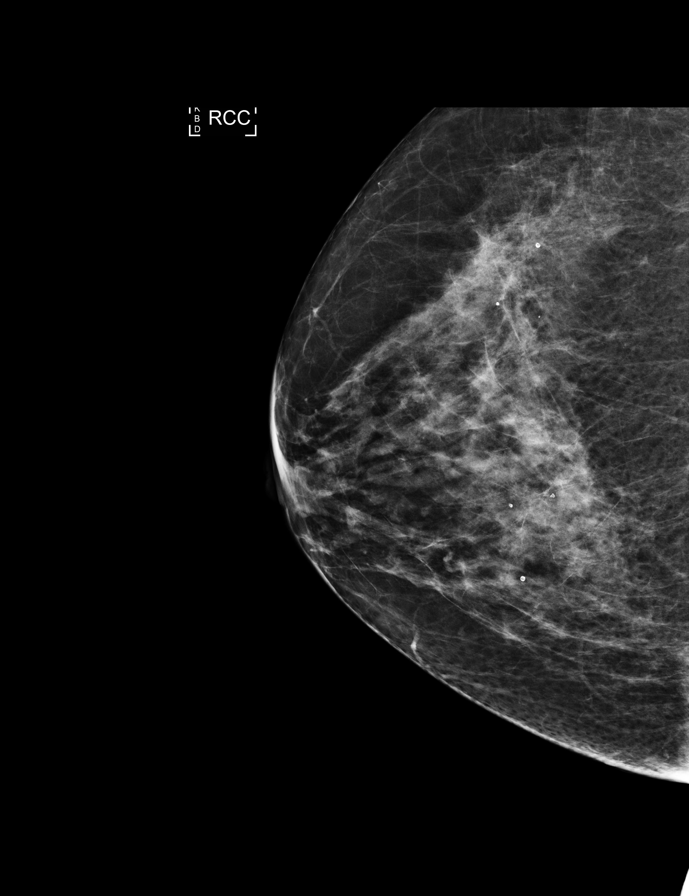

[8 of 30 positions shown; findings below may reference images not displayed]

ACR Breast Density Category c: The breast tissue is heterogeneously
dense, which may obscure small masses.
FINDINGS: In the right breast possible distortion requires further evaluation.

In the left breast possible distortion requires further evaluation.

Images were processed with CAD.
IMPRESSION: Further evaluation is suggested for possible distortion in the right
breast.

Further evaluation is suggested for possible distortion in the left
breast.

RECOMMENDATION:
Diagnostic mammogram and possibly ultrasound of both breasts.
(Code:[id number]-C-BBH)

The patient will be contacted regarding the findings, and additional
imaging will be scheduled.

BI-RADS CATEGORY  0: Incomplete. Need additional imaging evaluation
and/or prior mammograms for comparison.

## 2018-01-14 ENCOUNTER — Other Ambulatory Visit: Payer: Self-pay | Admitting: Internal Medicine

## 2018-01-14 DIAGNOSIS — R140 Abdominal distension (gaseous): Secondary | ICD-10-CM

## 2018-01-15 ENCOUNTER — Other Ambulatory Visit: Payer: Self-pay | Admitting: Internal Medicine

## 2018-01-15 DIAGNOSIS — Z1231 Encounter for screening mammogram for malignant neoplasm of breast: Secondary | ICD-10-CM

## 2018-01-22 ENCOUNTER — Ambulatory Visit
Admission: RE | Admit: 2018-01-22 | Discharge: 2018-01-22 | Disposition: A | Discharge status: Home / Self Care | Payer: Medicare Other | Source: Ambulatory Visit | Attending: Internal Medicine | Admitting: Internal Medicine

## 2018-01-22 DIAGNOSIS — R140 Abdominal distension (gaseous): Secondary | ICD-10-CM | POA: Insufficient documentation

## 2018-01-22 DIAGNOSIS — K760 Fatty (change of) liver, not elsewhere classified: Secondary | ICD-10-CM | POA: Insufficient documentation

## 2018-02-19 NOTE — Progress Notes (Addendum)
02/20/2018 10:23 AM   Mary Buckley November 24, 1940 [id number]  Referring provider: Marguarite Arbour, MD [address] [facility] [place], [place] [address]  Chief Complaint  Patient presents with  . Hematuria    1year w/ PVR    HPI: 77 yo WF with a history of hematuria and frequency who presents today for a one year follow up.    She underwent a formal hematuria workup which was completed in April 2016. It was grossly negative though she had poor opacifications of the right greater than left distal ureter. She also had a very small left renal lesion is too small to characterize but was most likely a renal cyst. Her follow-up renal ultrasound in February 2017 which was normal with no evidence of tumor. She has persistent microscopic hematuria today. She's had no complaints of the last year. Denies any urinary tract infections last 12 months. She denies any other changes in her urinary status. She denies gross hematuria.  Her UA today notes 3-10 RBC's.    She is currently experiencing urgency 0-3 (unchanged), frequency 4-7 (unchanged), she is not restricting fluids to limit visits to the restroom, she does not engage in toilet mapping, incontinence x 0-3 (unchanged) and nocturia 0-3 (unchanged).  She is not experiencing dysuria, gross hematuria or suprapubic pain. She also denies fevers, chills, nausea or vomiting.  Her PVR is 11 mL.    She underwent an abdominal ultrasound on January 22, 2018 for bloating.   Her kidneys were found in normal limits.     PMH: Past Medical History:  Diagnosis Date  . Arthritis   . Atrophic vaginitis   . Chronic kidney disease   . Coronary artery disease   . GERD (gastroesophageal reflux disease)   . Hematuria    microscopic  . Hepatitis   . Hyperlipidemia   . Hypertension   . Kidney stone   . Peripheral vascular disease (HCC)   . Portal vein thrombosis     Surgical History: Past Surgical History:  Procedure Laterality Date   . CERVICAL SPINE SURGERY     ruptured disc   patient states never any surgery for this  . COLONOSCOPY WITH PROPOFOL N/A 10/12/2015   Procedure: COLONOSCOPY WITH PROPOFOL;  Surgeon: Scot Jun, MD;  Location: [facility] ENDOSCOPY;  Service: Endoscopy;  Laterality: N/A;  . HEEL SPUR SURGERY    . LIP REPAIR N/A   . TONSILLECTOMY      Home Medications:  Allergies as of 02/20/2018      Reactions   Avelox [moxifloxacin Hcl In Nacl]    Bextra [valdecoxib]    Chlorzoxazone    Fosamax [alendronate Sodium]    Guaifenesin & Derivatives    Lodine [etodolac]    Methocarbamol Other (See Comments)   GI upset   Penicillins    Prednisone    Other reaction(s): Other (See Comments) flushing      Medication List        Accurate as of 02/20/18 10:23 AM. Always use your most recent med list.          acetaminophen 325 MG tablet Commonly known as:  TYLENOL Take 650 mg by mouth every 8 (eight) hours as needed.   calcium carbonate 500 MG chewable tablet Commonly known as:  TUMS - dosed in mg elemental calcium Chew 2 tablets by mouth daily.   CALCIUM PO Take by mouth.   celecoxib 200 MG capsule Commonly known as:  CELEBREX Take 200 mg by mouth  daily.   cyanocobalamin 1000 MCG tablet Take 100 mcg by mouth daily.   cyclobenzaprine 5 MG tablet Commonly known as:  FLEXERIL Take 5 mg by mouth 3 (three) times daily as needed for muscle spasms.   FISH OIL PO Take by mouth.   fluticasone 50 MCG/ACT nasal spray Commonly known as:  FLONASE Place 2 sprays into both nostrils daily.   loratadine 10 MG tablet Commonly known as:  CLARITIN Take 10 mg by mouth daily.   nebivolol 10 MG tablet Commonly known as:  BYSTOLIC Take by mouth.   omega-3 acid ethyl esters 1 g capsule Commonly known as:  LOVAZA Take 1 g by mouth 2 (two) times daily.   pantoprazole 40 MG tablet Commonly known as:  PROTONIX Take 40 mg by mouth daily.   Propylene Glycol 0.6 % Soln Place 1 drop into both eyes  daily.   rosuvastatin 5 MG tablet Commonly known as:  CRESTOR Take 5 mg by mouth daily.   sodium chloride 0.65 % nasal spray Commonly known as:  OCEAN 2 sprays by Nasal route once daily.   sodium chloride inhaler solution Commonly known as:  BRONCHO SALINE Take 2 sprays by nebulization as needed.   warfarin 2 MG tablet Commonly known as:  COUMADIN Take 2 mg by mouth daily.       Allergies:  Allergies  Allergen Reactions  . Avelox [Moxifloxacin Hcl In Nacl]   . Bextra [Valdecoxib]   . Chlorzoxazone   . Fosamax [Alendronate Sodium]   . Guaifenesin & Derivatives   . Lodine [Etodolac]   . Methocarbamol Other (See Comments)    GI upset  . Penicillins   . Prednisone     Other reaction(s): Other (See Comments) flushing    Family History: Family History  Problem Relation Age of Onset  . Arrhythmia Mother        pacemaker placed  . Transient ischemic attack Father   . Prostate cancer Brother   . Bladder Cancer Neg Hx   . Kidney cancer Neg Hx   . Breast cancer Neg Hx     Social History:  reports that she has never smoked. She has never used smokeless tobacco. She reports that she does not drink alcohol or use drugs.  ROS: UROLOGY Frequent Urination?: No Hard to postpone urination?: No Burning/pain with urination?: No Get up at night to urinate?: No Leakage of urine?: No Urine stream starts and stops?: No Trouble starting stream?: No Do you have to strain to urinate?: No Blood in urine?: Yes Urinary tract infection?: No Sexually transmitted disease?: No Injury to kidneys or bladder?: No Painful intercourse?: No Weak stream?: No Currently pregnant?: No Vaginal bleeding?: No Last menstrual period?: n  Gastrointestinal Nausea?: No Vomiting?: No Indigestion/heartburn?: No Diarrhea?: No Constipation?: Yes  Constitutional Fever: No Night sweats?: No Weight loss?: No Fatigue?: No  Skin Skin rash/lesions?: No Itching?: No  Eyes Blurred vision?:  No Double vision?: No  Ears/Nose/Throat Sore throat?: No Sinus problems?: No  Hematologic/Lymphatic Swollen glands?: No Easy bruising?: No  Cardiovascular Leg swelling?: No Chest pain?: No  Respiratory Cough?: No Shortness of breath?: No  Endocrine Excessive thirst?: No  Musculoskeletal Back pain?: Yes Joint pain?: No  Neurological Headaches?: No Dizziness?: No  Psychologic Depression?: No Anxiety?: No  Physical Exam: BP 135/79   Pulse 79   Ht 5\' 3"  (1.6 m)   Wt 150 lb (68 kg)   BMI 26.57 kg/m   Constitutional: Well nourished. Alert and oriented, No acute  distress. HEENT: [place] AT, moist mucus membranes. Trachea midline, no masses. Cardiovascular: No clubbing, cyanosis, or edema. Respiratory: Normal respiratory effort, no increased work of breathing. GI: Abdomen is soft, non tender, non distended, no abdominal masses. Liver and spleen not palpable.  No hernias appreciated.  Stool sample for occult testing is not indicated.   GU: No CVA tenderness.  No bladder fullness or masses.   Skin: No rashes, bruises or suspicious lesions. Lymph: No cervical or inguinal adenopathy. Neurologic: Grossly intact, no focal deficits, moving all 4 extremities. Psychiatric: Normal mood and affect.  Laboratory Data: Lab Results  Component Value Date   WBC 3.7 10/01/2014   HGB 11.8 (L) 10/01/2014   HCT 35.1 10/01/2014   MCV 97 10/01/2014   PLT 214 10/01/2014    Lab Results  Component Value Date   CREATININE 1.22 10/01/2014    Urinalysis 3-10 RBC's.  See EPIC.  Pertinent imaging CLINICAL DATA:  Abdominal pain and bloating  EXAM: ABDOMEN ULTRASOUND COMPLETE  COMPARISON:  1/13/6  FINDINGS: Gallbladder: No gallstones or wall thickening visualized. No sonographic Murphy sign noted by sonographer.  Common bile duct: Diameter: 3 mm.  Liver: Mild increased echogenicity is noted consistent with fatty infiltration. No focal mass lesion is noted. Portal vein is  patent on color Doppler imaging with normal direction of blood flow towards the liver.  IVC: No abnormality visualized.  Pancreas: Visualized portion unremarkable.  Spleen: Size and appearance within normal limits.  Right Kidney: Length: 10.1 cm. Echogenicity within normal limits. No mass or hydronephrosis visualized.  Left Kidney: Length: 10.3 cm. Echogenicity within normal limits. No mass or hydronephrosis visualized.  Abdominal aorta: No aneurysm visualized. Atherosclerotic calcifications are noted.  Other findings: None.  IMPRESSION: Fatty liver.  No acute abnormality noted.   Electronically Signed   By: Alcide Clever M.D.   On: 01/22/2018 16:23  I have independently reviewed the films  Assessment & Plan:    1. Microscopic hematuria  - Negative workup completed in April 2016. Patient sells persistent microscopic hematuria. However follow up in 1 year to monitor her urine for microhematuria.  We discussed that if she has persistent microscopic hematuria for 3-5 years that we would generally would repeat her hematuria workup at that time.  - UA is positive for 3-10 RBC's   - I have explained to the patient that they will  be scheduled for a cystoscopy in our office to evaluate their bladder.  The cystoscopy consists of passing a tube with a lens up through their urethra and into their urinary bladder.   We will inject the urethra with a lidocaine gel prior to introducing the cystoscope to help with any discomfort during the procedure.   After the procedure, they might experience blood in the urine and discomfort with urination.  This will abate after the first few voids.  I have  encouraged the patient to increase water intake  during this time.  Patient denies any allergies to lidocaine.   2. Frequency   - patient does not find her symptoms bothersome at this time  - fluid management - patient has adequate fluid intake  - RTC in 1 year for OAB  questionnaire  Return for cystoscopy .  Michiel Cowboy, PA-C  [facility] [address], [address] [place], [place] [address] [phone]

## 2018-02-20 ENCOUNTER — Ambulatory Visit (INDEPENDENT_AMBULATORY_CARE_PROVIDER_SITE_OTHER): Payer: Medicare Other | Admitting: Urology

## 2018-02-20 ENCOUNTER — Encounter: Payer: Self-pay | Admitting: Urology

## 2018-02-20 VITALS — BP 135/79 | HR 79 | Ht 63.0 in | Wt 150.0 lb

## 2018-02-20 DIAGNOSIS — R350 Frequency of micturition: Secondary | ICD-10-CM | POA: Diagnosis not present

## 2018-02-20 DIAGNOSIS — R3129 Other microscopic hematuria: Secondary | ICD-10-CM | POA: Diagnosis not present

## 2018-02-20 DIAGNOSIS — Z87448 Personal history of other diseases of urinary system: Secondary | ICD-10-CM

## 2018-02-20 LAB — URINALYSIS, COMPLETE
BILIRUBIN UA: NEGATIVE
GLUCOSE, UA: NEGATIVE
KETONES UA: NEGATIVE
Nitrite, UA: NEGATIVE
PROTEIN UA: NEGATIVE
Specific Gravity, UA: <1.005 — ABNORMAL LOW (ref 1.005–1.030)
UUROB: 0.2 mg/dL (ref 0.2–1.0)
pH, UA: 5.5 (ref 5.0–7.5)

## 2018-02-20 LAB — MICROSCOPIC EXAMINATION: Bacteria, UA: NONE SEEN

## 2018-02-20 LAB — BLADDER SCAN AMB NON-IMAGING

## 2018-02-20 NOTE — Patient Instructions (Signed)
Cystoscopy  Cystoscopy is a procedure that is used to help diagnose and sometimes treat conditions that affect that lower urinary tract. The lower urinary tract includes the bladder and the tube that drains urine from the bladder out of the body (urethra). Cystoscopy is performed with a thin, tube-shaped instrument with a light and camera at the end (cystoscope). The cystoscope may be hard (rigid) or flexible, depending on the goal of the procedure.The cystoscope is inserted through the urethra, into the bladder.  Cystoscopy may be recommended if you have:   Urinary tractinfections that keep coming back (recurring).   Blood in the urine (hematuria).   Loss of bladder control (urinary incontinence) or an overactive bladder.   Unusual cells found in a urine sample.   A blockage in the urethra.   Painful urination.   An abnormality in the bladder found during an intravenous pyelogram (IVP) or CT scan.    Cystoscopy may also be done to remove a sample of tissue to be examined under a microscope (biopsy).  Tell a health care provider about:   Any allergies you have.   All medicines you are taking, including vitamins, herbs, eye drops, creams, and over-the-counter medicines.   Any problems you or family members have had with anesthetic medicines.   Any blood disorders you have.   Any surgeries you have had.   Any medical conditions you have.   Whether you are pregnant or may be pregnant.  What are the risks?  Generally, this is a safe procedure. However, problems may occur, including:   Infection.   Bleeding.   Allergic reactions to medicines.   Damage to other structures or organs.    What happens before the procedure?   Ask your health care provider about:  ? Changing or stopping your regular medicines. This is especially important if you are taking diabetes medicines or blood thinners.  ? Taking medicines such as aspirin and ibuprofen. These medicines can thin your blood. Do not take these medicines  before your procedure if your health care provider instructs you not to.   Follow instructions from your health care provider about eating or drinking restrictions.   You may be given antibiotic medicine to help prevent infection.   You may have an exam or testing, such as X-rays of the bladder, urethra, or kidneys.   You may have urine tests to check for signs of infection.   Plan to have someone take you home after the procedure.  What happens during the procedure?   To reduce your risk of infection,your health care team will wash or sanitize their hands.   You will be given one or more of the following:  ? A medicine to help you relax (sedative).  ? A medicine to numb the area (local anesthetic).   The area around the opening of your urethra will be cleaned.   The cystoscope will be passed through your urethra into your bladder.   Germ-free (sterile)fluid will flow through the cystoscope to fill your bladder. The fluid will stretch your bladder so that your surgeon can clearly examine your bladder walls.   The cystoscope will be removed and your bladder will be emptied.  The procedure may vary among health care providers and hospitals.  What happens after the procedure?   You may have some soreness or pain in your abdomen and urethra. Medicines will be available to help you.   You may have some blood in your urine.   Do not   drive for 24 hours if you received a sedative.  This information is not intended to replace advice given to you by your health care provider. Make sure you discuss any questions you have with your health care provider.  Document Released: 10/26/2000 Document Revised: 03/08/2016 Document Reviewed: 09/15/2015  [employer]vier Interactive Patient Education  2018 [employer]

## 2018-03-14 ENCOUNTER — Other Ambulatory Visit: Payer: Medicare Other

## 2018-03-20 ENCOUNTER — Ambulatory Visit (INDEPENDENT_AMBULATORY_CARE_PROVIDER_SITE_OTHER): Payer: Medicare Other | Admitting: Urology

## 2018-03-20 ENCOUNTER — Encounter: Payer: Self-pay | Admitting: Urology

## 2018-03-20 VITALS — BP 146/85 | HR 64 | Ht 63.0 in | Wt 154.0 lb

## 2018-03-20 DIAGNOSIS — R3129 Other microscopic hematuria: Secondary | ICD-10-CM

## 2018-03-20 DIAGNOSIS — R350 Frequency of micturition: Secondary | ICD-10-CM

## 2018-03-20 LAB — URINALYSIS, COMPLETE
Bilirubin, UA: NEGATIVE
Glucose, UA: NEGATIVE
Ketones, UA: NEGATIVE
Nitrite, UA: NEGATIVE
PH UA: 6.5 (ref 5.0–7.5)
Protein, UA: NEGATIVE
Specific Gravity, UA: <1.005 — ABNORMAL LOW (ref 1.005–1.030)
UUROB: 0.2 mg/dL (ref 0.2–1.0)

## 2018-03-20 LAB — MICROSCOPIC EXAMINATION

## 2018-03-20 MED ORDER — LIDOCAINE HCL URETHRAL/MUCOSAL 2 % EX GEL
1.0000 "application " | Freq: Once | CUTANEOUS | Status: AC
  Administered 2018-03-20: 1 via URETHRAL

## 2018-03-20 MED ORDER — DOXYCYCLINE HYCLATE 100 MG PO TABS
100.0000 mg | ORAL_TABLET | Freq: Once | ORAL | Status: AC
  Administered 2018-03-20: 100 mg via ORAL

## 2018-03-20 NOTE — Progress Notes (Signed)
   03/20/18  CC:  Chief Complaint  Patient presents with  . Cysto    HPI: 77 yo WF with a history of hematuria and frequency who presents today for a one year follow up.    She underwent a formal hematuria workup which was completed in April 2016. It was grossly negative though she had poor opacifications of the right greater than left distal ureter. She also had a very small left renal lesion that was too small to characterize but was most likely a renal cyst. Her follow-up renal ultrasound in February 2017 was normal with no evidence of tumor. She has persistent microscopic hematuria today. She's had no complaints of the last year. Denies any urinary tract infections last 12 months. She denies any other changes in her urinary status. She denies gross hematuria.  Her UA today notes 3-10 RBC's.    She is currently experiencing urgency 0-3 (unchanged), frequency 4-7 (unchanged), she is not restricting fluids to limit visits to the restroom, she does not engage in toilet mapping, incontinence x 0-3 (unchanged) and nocturia 0-3 (unchanged).  She is not experiencing dysuria, gross hematuria or suprapubic pain. She also denies fevers, chills, nausea or vomiting.  Her PVR is 11 mL.    She underwent an abdominal ultrasound on January 22, 2018 for bloating.   Her kidneys were found in normal limits.   She presents today for cystoscopy for persistent microscopic hematuria.     Blood pressure (!) 146/85, pulse 64, height  (1.6 m), weight 154 lb (69.9 kg). NED. A&Ox3.   No respiratory distress   Abd soft, NT, ND Normal external genitalia with patent urethral meatus  Cystoscopy Procedure Note  Patient identification was confirmed, informed consent was obtained, and patient was prepped using Betadine solution.  Lidocaine jelly was administered per urethral meatus.    Preoperative abx where received prior to procedure.    Procedure: - Flexible cystoscope introduced, without any  difficulty.   - Thorough search of the bladder revealed:    normal urethral meatus    normal urothelium    no stones    no ulcers     no tumors    no urethral polyps    no trabeculation  - Ureteral orifices were normal in position and appearance.  Post-Procedure: - Patient tolerated the procedure well  Assessment/ Plan:  1. Microscopic hematuria             - Negative formal workup completed in April 2016.  Negative cystoscopy and renal ultrasound in May 2019.  No further work-up required unless she develops gross hematuria.  2. Frequency              - patient does not find her symptoms bothersome at this time             - fluid management - patient has adequate fluid intake             - RTC in 1 year for OAB questionnaire  Hildred Laser, MD

## 2018-04-02 ENCOUNTER — Ambulatory Visit
Admission: RE | Admit: 2018-04-02 | Discharge: 2018-04-02 | Disposition: A | Discharge status: Home / Self Care | Payer: Medicare Other | Source: Ambulatory Visit | Attending: Internal Medicine | Admitting: Internal Medicine

## 2018-04-02 DIAGNOSIS — Z1231 Encounter for screening mammogram for malignant neoplasm of breast: Secondary | ICD-10-CM | POA: Diagnosis present

## 2019-01-26 ENCOUNTER — Other Ambulatory Visit: Payer: Self-pay | Admitting: Internal Medicine

## 2019-01-26 DIAGNOSIS — Z1231 Encounter for screening mammogram for malignant neoplasm of breast: Secondary | ICD-10-CM

## 2019-03-23 ENCOUNTER — Ambulatory Visit: Payer: Medicare Other | Admitting: Urology

## 2019-03-25 ENCOUNTER — Ambulatory Visit: Payer: Medicare Other | Admitting: Urology

## 2019-03-25 ENCOUNTER — Telehealth: Payer: Self-pay | Admitting: Urology

## 2019-03-25 ENCOUNTER — Other Ambulatory Visit: Payer: Self-pay

## 2019-03-25 ENCOUNTER — Telehealth (INDEPENDENT_AMBULATORY_CARE_PROVIDER_SITE_OTHER): Payer: Medicare Other | Admitting: Urology

## 2019-03-25 DIAGNOSIS — R351 Nocturia: Secondary | ICD-10-CM

## 2019-03-25 DIAGNOSIS — N183 Chronic kidney disease, stage 3 unspecified: Secondary | ICD-10-CM

## 2019-03-25 DIAGNOSIS — Z87448 Personal history of other diseases of urinary system: Secondary | ICD-10-CM

## 2019-03-25 DIAGNOSIS — R3915 Urgency of urination: Secondary | ICD-10-CM

## 2019-03-25 NOTE — Progress Notes (Signed)
Virtual Visit via Telephone Note  I connected with Mary Buckley on 03/25/2019 at 0930 by telephone and verified that I am speaking with the correct person using two identifiers.  They are located at home.  I am located at my home.    This visit type was conducted due to national recommendations for restrictions regarding the COVID-19 Pandemic (e.g. social distancing).  This format is felt to be most appropriate for this patient at this time.  All issues noted in this document were discussed and addressed.  No physical exam was performed.   I discussed the limitations, risks, security and privacy concerns of performing an evaluation and management service by telephone and the availability of in person appointments. I also discussed with the patient that there may be a patient responsible charge related to this service. The patient expressed understanding and agreed to proceed.   History of Present Illness: Mary Buckley is a 78 year old female with a history of hematuria and urinary frequency who is contacted today by telephone due to the COVID-19 pandemic.    She has not had any episodes of gross hematuria over the last year.  She had some questions regarding having CKD stage 3.    She was also experiencing some increase in urinary urgency and nocturia, but it has decreased since she has been at home.  She is now not postponing urination when she feels the urge and she does not feel that she is making excessive trips to the restroom.  She is getting up 1 to 2 times nightly, but she does admit she is eating later in the day than usual due to the pandemic and the stay at home ordinance.  Patient denies any gross hematuria, dysuria or suprapubic/flank pain.  Patient denies any fevers, chills, nausea or vomiting.    Observations/Objective: Mary Buckley does not sound distressed and answers questions appropriately.    Assessment and Plan:  1. History of hematuria Hematuria work up completed in 2016  and 2019 - work ups negative No report of gross hematuria - she is to report any gross hematuria in the interim    2. Urgency Resolved with not postponing urination  3. Nocturia Discussed limiting fluids prior to bedtime and caffeine after lunch  4. CKD 3 Reviewed her labs with her and explained that her kidney function appears to have been stable over the last five years Advised her to stay away from nephrotoxic agents and to limit animal protein from her diet   Follow Up Instructions:  Mary Buckley will follow up in one year for OAB questionnaire and PVR.   I discussed the assessment and treatment plan with the patient. The patient was provided an opportunity to ask questions and all were answered. The patient agreed with the plan and demonstrated an understanding of the instructions.   The patient was advised to call back or seek an in-person evaluation if the symptoms worsen or if the condition fails to improve as anticipated.  I provided 19 minutes of non-face-to-face time during this encounter.   Bladyn Tipps, PA-C

## 2019-03-25 NOTE — Telephone Encounter (Signed)
Would you call Mrs. Simonis and schedule her for a one year follow up for an OAB questionnaire and PVR?

## 2019-04-09 ENCOUNTER — Ambulatory Visit
Admission: RE | Admit: 2019-04-09 | Discharge: 2019-04-09 | Disposition: A | Discharge status: Home / Self Care | Payer: Medicare Other | Source: Ambulatory Visit | Attending: Internal Medicine | Admitting: Internal Medicine

## 2019-04-09 ENCOUNTER — Other Ambulatory Visit: Payer: Self-pay

## 2019-04-09 DIAGNOSIS — Z1231 Encounter for screening mammogram for malignant neoplasm of breast: Secondary | ICD-10-CM | POA: Diagnosis present

## 2020-03-24 ENCOUNTER — Other Ambulatory Visit: Payer: Self-pay | Admitting: Internal Medicine

## 2020-03-24 DIAGNOSIS — Z1231 Encounter for screening mammogram for malignant neoplasm of breast: Secondary | ICD-10-CM

## 2020-03-25 ENCOUNTER — Other Ambulatory Visit: Payer: Self-pay

## 2020-03-25 ENCOUNTER — Ambulatory Visit (INDEPENDENT_AMBULATORY_CARE_PROVIDER_SITE_OTHER): Payer: Medicare Other | Admitting: Urology

## 2020-03-25 ENCOUNTER — Encounter: Payer: Self-pay | Admitting: Urology

## 2020-03-25 VITALS — BP 137/83 | HR 80 | Ht 63.0 in | Wt 150.0 lb

## 2020-03-25 DIAGNOSIS — R3915 Urgency of urination: Secondary | ICD-10-CM | POA: Diagnosis not present

## 2020-03-25 LAB — BLADDER SCAN AMB NON-IMAGING: Scan Result: 28

## 2020-03-25 NOTE — Progress Notes (Signed)
03/25/2020 9:47 AM   Mary Buckley Aug 02, 1941 [id number]  Referring provider: Marguarite Arbour, MD [address] [facility] [place],  [place] [address]  Chief Complaint  Patient presents with  . Urinary Urgency    HPI: 79 yo female with a history of hematuria and frequency who presents today for a one year follow up.    History of hematuria (high risk) Non-smoker.  CTU 11/2014 no calcifications within the collecting system of either kidney, along the course of either ureter, or within the lumen of the urinary bladder. No hydroureteronephrosis or perinephric stranding to indicate urinary tract obstruction at this time. Post-contrast images of the right kidney are normal. Sub cm low-attenuation lesion in the lateral aspect of the upper pole of the left kidney is too small to definitively characterize, but is statistically likely a tiny cyst. The appearance of the urinary bladder is normal. Normal bilateral adrenal glands.  Her follow-up renal ultrasound in February 2017 which was normal with no evidence of tumor.   She underwent an abdominal ultrasound on January 22, 2018 for bloating.   Her kidneys were found in normal limits.  Cystoscopy in 03/2018 NED with Dr. Sherryl Barters.  She does not report gross hematuria.    Frequency She is currently experiencing urgency  0-3 (stable), frequency 4-7 (stable), she is not restricting fluids to limit visits to the restroom, she does not engage in toilet mapping, incontinence x 0-3 (stable) and nocturia  0-3 (stable).   Her BP is 137/83.  Her PVR is 28 mL.     PMH: Past Medical History:  Diagnosis Date  . Arthritis   . Atrophic vaginitis   . Chronic kidney disease   . Coronary artery disease   . GERD (gastroesophageal reflux disease)   . Hematuria    microscopic  . Hepatitis   . Hyperlipidemia   . Hypertension   . Kidney stone   . Peripheral vascular disease (HCC)   . Portal vein thrombosis     Surgical History: Past  Surgical History:  Procedure Laterality Date  . CERVICAL SPINE SURGERY     ruptured disc   patient states never any surgery for this  . COLONOSCOPY WITH PROPOFOL N/A 10/12/2015   Procedure: COLONOSCOPY WITH PROPOFOL;  Surgeon: Scot Jun, MD;  Location: [facility] ENDOSCOPY;  Service: Endoscopy;  Laterality: N/A;  . HEEL SPUR SURGERY    . LIP REPAIR N/A   . TONSILLECTOMY      Home Medications:  Allergies as of 03/25/2020      Reactions   Avelox [moxifloxacin Hcl In Nacl]    Bextra [valdecoxib]    Chlorzoxazone    Fosamax [alendronate Sodium]    Guaifenesin & Derivatives    Lodine [etodolac]    Methocarbamol Other (See Comments)   GI upset   Penicillins    Prednisone    Other reaction(s): Other (See Comments) flushing      Medication List       Accurate as of Mar 25, 2020  9:47 AM. If you have any questions, ask your nurse or doctor.        STOP taking these medications   calcium carbonate 500 MG chewable tablet Commonly known as: TUMS - dosed in mg elemental calcium Stopped by: Shantella Blubaugh, PA-C   fluticasone 50 MCG/ACT nasal spray Commonly known as: FLONASE Stopped by: Tajee Savant, PA-C   sodium chloride 0.65 % nasal spray Commonly known as: OCEAN Stopped by: Michiel Cowboy, PA-C  sodium chloride inhaler solution Commonly known as: BRONCHO SALINE Stopped by: Zara Council, PA-C     TAKE these medications   acetaminophen 325 MG tablet Commonly known as: TYLENOL Take 650 mg by mouth every 8 (eight) hours as needed.   augmented betamethasone dipropionate 0.05 % ointment Commonly known as: DIPROLENE-AF Apply topically.   CALCIUM PO Take by mouth.   celecoxib 200 MG capsule Commonly known as: CELEBREX Take 200 mg by mouth daily.   cyanocobalamin 1000 MCG tablet Take 100 mcg by mouth daily.   cyclobenzaprine 5 MG tablet Commonly known as: FLEXERIL Take 5 mg by mouth 3 (three) times daily as needed for muscle spasms.   Dialyvite Vitamin  D 5000 125 MCG (5000 UT) capsule Generic drug: Cholecalciferol Take 5,000 Units by mouth daily.   ibandronate 150 MG tablet Commonly known as: BONIVA Take 150 mg by mouth every 30 (thirty) days.   loratadine 10 MG tablet Commonly known as: CLARITIN Take 10 mg by mouth daily.   losartan 50 MG tablet Commonly known as: COZAAR Take 50 mg by mouth daily.   nebivolol 10 MG tablet Commonly known as: BYSTOLIC Take by mouth.   omega-3 acid ethyl esters 1 g capsule Commonly known as: LOVAZA Take 1 g by mouth 2 (two) times daily.   pantoprazole 40 MG tablet Commonly known as: PROTONIX Take 40 mg by mouth daily.   Propylene Glycol 0.6 % Soln Place 1 drop into both eyes daily.   rosuvastatin 5 MG tablet Commonly known as: CRESTOR Take 5 mg by mouth daily.   warfarin 2 MG tablet Commonly known as: COUMADIN Take 2 mg by mouth daily.       Allergies:  Allergies  Allergen Reactions  . Avelox [Moxifloxacin Hcl In Nacl]   . Bextra [Valdecoxib]   . Chlorzoxazone   . Fosamax [Alendronate Sodium]   . Guaifenesin & Derivatives   . Lodine [Etodolac]   . Methocarbamol Other (See Comments)    GI upset  . Penicillins   . Prednisone     Other reaction(s): Other (See Comments) flushing    Family History: Family History  Problem Relation Age of Onset  . Arrhythmia Mother        pacemaker placed  . Transient ischemic attack Father   . Prostate cancer Brother   . Bladder Cancer Neg Hx   . Kidney cancer Neg Hx   . Breast cancer Neg Hx     Social History:  reports that she has never smoked. She has never used smokeless tobacco. She reports that she does not drink alcohol or use drugs.  ROS: For pertinent review of systems please refer to history of present illness  Physical Exam: BP 137/83   Pulse 80   Ht 5\' 3"  (1.6 m)   Wt 150 lb (68 kg)   BMI 26.57 kg/m   Constitutional:  Well nourished. Alert and oriented, No acute distress. HEENT: [place] AT, mask in place  Trachea  midline Cardiovascular: No clubbing, cyanosis, or edema. Respiratory: Normal respiratory effort, no increased work of breathing. Neurologic: Grossly intact, no focal deficits, moving all 4 extremities. Psychiatric: Normal mood and affect.   Laboratory Data: Lab Results  Component Value Date   WBC 3.7 10/01/2014   HGB 11.8 (L) 10/01/2014   HCT 35.1 10/01/2014   MCV 97 10/01/2014   PLT 214 10/01/2014    Lab Results  Component Value Date   CREATININE 1.22 10/01/2014  I have reviewed the labs.  Pertinent imaging  Results for TONYE, TANCREDI (MRN [id number]) as of 03/25/2020 09:43  Ref. Range 03/25/2020 09:30  Scan Result Unknown 28   Assessment & Plan:    1. Microscopic hematuria Likely benign as she has had two negative work up for hematuria No reports of gross hematuria She will contact us if she should experience gross hematuria  2. Frequency Patient does not find her symptoms bothersome at this time   Return if symptoms worsen or fail to improve.  Michiel Cowboy, PA-C  [facility] [address] [place], [place] [address] [phone]

## 2020-04-13 ENCOUNTER — Ambulatory Visit
Admission: RE | Admit: 2020-04-13 | Discharge: 2020-04-13 | Disposition: A | Discharge status: Home / Self Care | Payer: Medicare Other | Source: Ambulatory Visit | Attending: Internal Medicine | Admitting: Internal Medicine

## 2020-04-13 DIAGNOSIS — Z1231 Encounter for screening mammogram for malignant neoplasm of breast: Secondary | ICD-10-CM | POA: Insufficient documentation

## 2020-04-18 ENCOUNTER — Other Ambulatory Visit: Payer: Self-pay | Admitting: Internal Medicine

## 2020-04-18 DIAGNOSIS — R928 Other abnormal and inconclusive findings on diagnostic imaging of breast: Secondary | ICD-10-CM

## 2020-04-20 ENCOUNTER — Ambulatory Visit
Admission: RE | Admit: 2020-04-20 | Discharge: 2020-04-20 | Disposition: A | Discharge status: Home / Self Care | Payer: Medicare Other | Source: Ambulatory Visit | Attending: Internal Medicine | Admitting: Internal Medicine

## 2020-04-20 DIAGNOSIS — R928 Other abnormal and inconclusive findings on diagnostic imaging of breast: Secondary | ICD-10-CM | POA: Diagnosis present

## 2020-04-21 ENCOUNTER — Other Ambulatory Visit: Payer: Self-pay | Admitting: Internal Medicine

## 2020-04-21 DIAGNOSIS — R928 Other abnormal and inconclusive findings on diagnostic imaging of breast: Secondary | ICD-10-CM

## 2020-04-21 DIAGNOSIS — N6310 Unspecified lump in the right breast, unspecified quadrant: Secondary | ICD-10-CM

## 2020-04-27 ENCOUNTER — Ambulatory Visit
Admission: RE | Admit: 2020-04-27 | Discharge: 2020-04-27 | Disposition: A | Discharge status: Home / Self Care | Payer: Medicare Other | Source: Ambulatory Visit | Attending: Internal Medicine | Admitting: Internal Medicine

## 2020-04-27 DIAGNOSIS — R928 Other abnormal and inconclusive findings on diagnostic imaging of breast: Secondary | ICD-10-CM

## 2020-04-27 DIAGNOSIS — N6310 Unspecified lump in the right breast, unspecified quadrant: Secondary | ICD-10-CM | POA: Insufficient documentation

## 2020-04-27 HISTORY — PX: BREAST BIOPSY: SHX20

## 2020-04-28 LAB — SURGICAL PATHOLOGY

## 2020-08-29 ENCOUNTER — Other Ambulatory Visit: Payer: Self-pay | Admitting: Internal Medicine

## 2020-08-29 DIAGNOSIS — N6310 Unspecified lump in the right breast, unspecified quadrant: Secondary | ICD-10-CM

## 2020-10-28 ENCOUNTER — Ambulatory Visit: Payer: Medicare Other

## 2020-10-28 ENCOUNTER — Other Ambulatory Visit: Payer: Self-pay

## 2020-10-28 ENCOUNTER — Ambulatory Visit
Admission: RE | Admit: 2020-10-28 | Discharge: 2020-10-28 | Disposition: A | Discharge status: Home / Self Care | Payer: Medicare Other | Source: Ambulatory Visit | Attending: Internal Medicine | Admitting: Internal Medicine

## 2020-10-28 DIAGNOSIS — N6310 Unspecified lump in the right breast, unspecified quadrant: Secondary | ICD-10-CM | POA: Diagnosis present

## 2020-10-28 DIAGNOSIS — N6315 Unspecified lump in the right breast, overlapping quadrants: Secondary | ICD-10-CM | POA: Insufficient documentation

## 2021-02-21 ENCOUNTER — Other Ambulatory Visit: Payer: Self-pay | Admitting: Internal Medicine

## 2021-02-21 DIAGNOSIS — Z1231 Encounter for screening mammogram for malignant neoplasm of breast: Secondary | ICD-10-CM

## 2021-03-06 ENCOUNTER — Other Ambulatory Visit: Payer: Self-pay | Admitting: Internal Medicine

## 2021-03-06 DIAGNOSIS — N644 Mastodynia: Secondary | ICD-10-CM

## 2021-03-08 ENCOUNTER — Ambulatory Visit
Admission: RE | Admit: 2021-03-08 | Discharge: 2021-03-08 | Disposition: A | Discharge status: Home / Self Care | Payer: Medicare Other | Source: Ambulatory Visit | Attending: Internal Medicine | Admitting: Internal Medicine

## 2021-03-08 ENCOUNTER — Other Ambulatory Visit: Payer: Self-pay

## 2021-03-08 DIAGNOSIS — N644 Mastodynia: Secondary | ICD-10-CM

## 2021-09-07 IMAGING — US US BREAST*R* LIMITED INC AXILLA
1 series · 5 of 5 positions shown · non-contrast
Comparison: Previous exam(s).

CLINICAL DATA: Patient recalled from screening for possible right
breast distortion.

EXAM:
DIGITAL DIAGNOSTIC RIGHT MAMMOGRAM WITH CAD AND TOMO
ULTRASOUND RIGHT BREAST

[Series 1: us breast*right* limited inc axilla · 0.08mm/px · 5 of 5 slices shown]
[im 1/5]
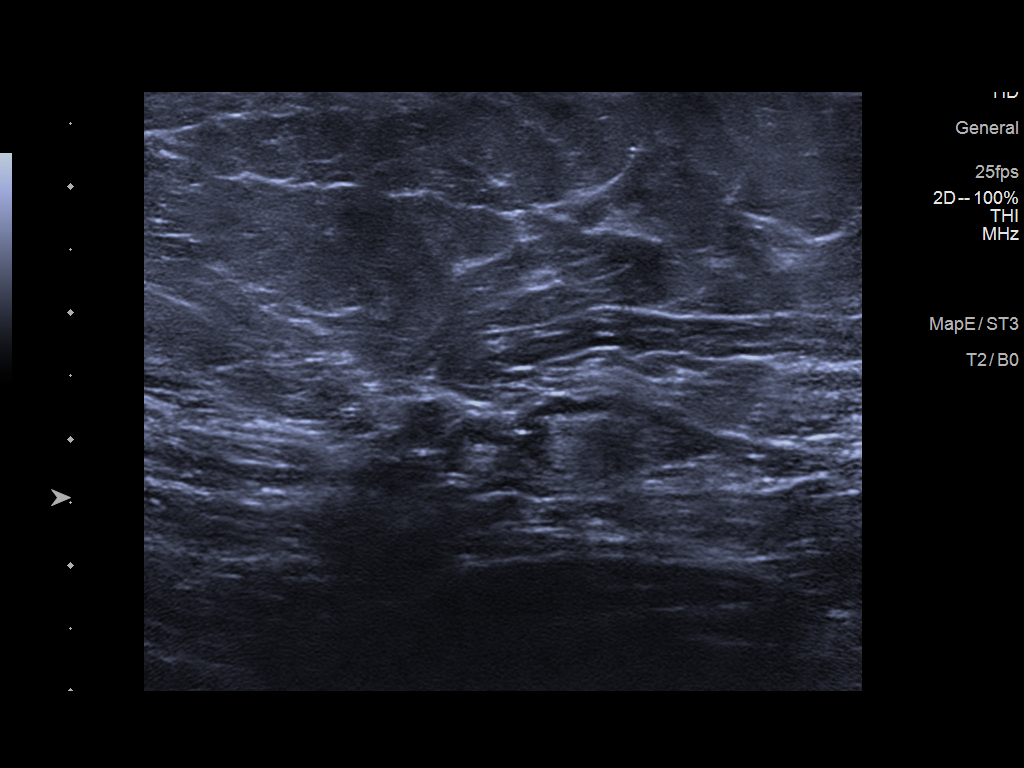
[im 2/5]
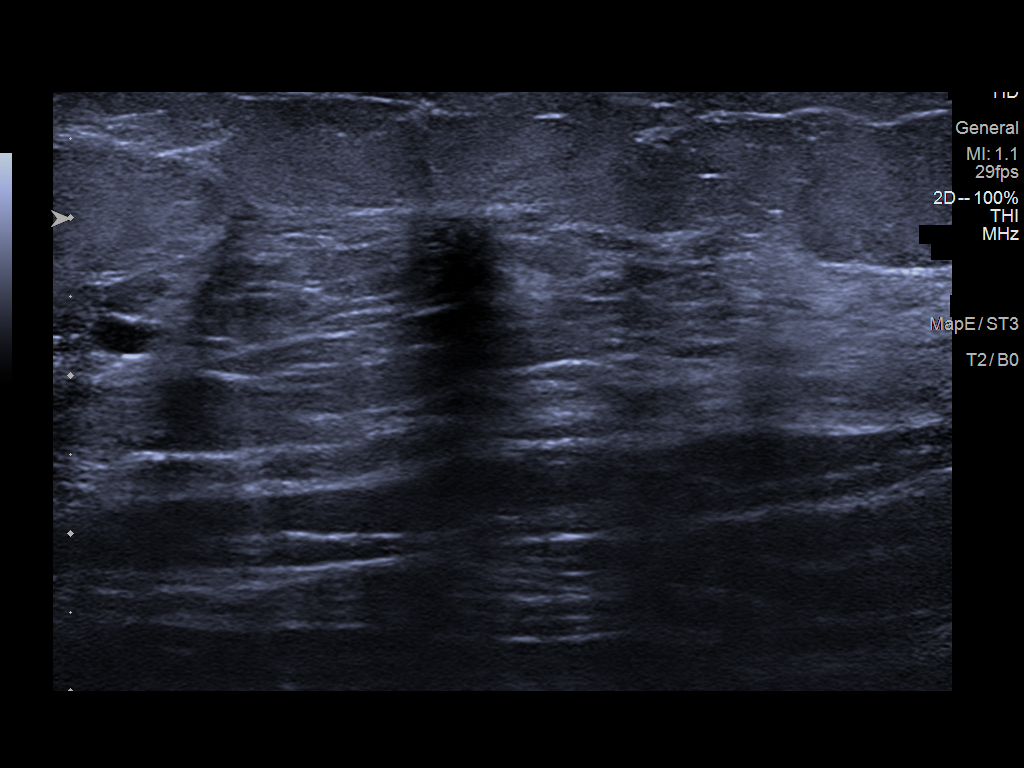
[im 3/5]
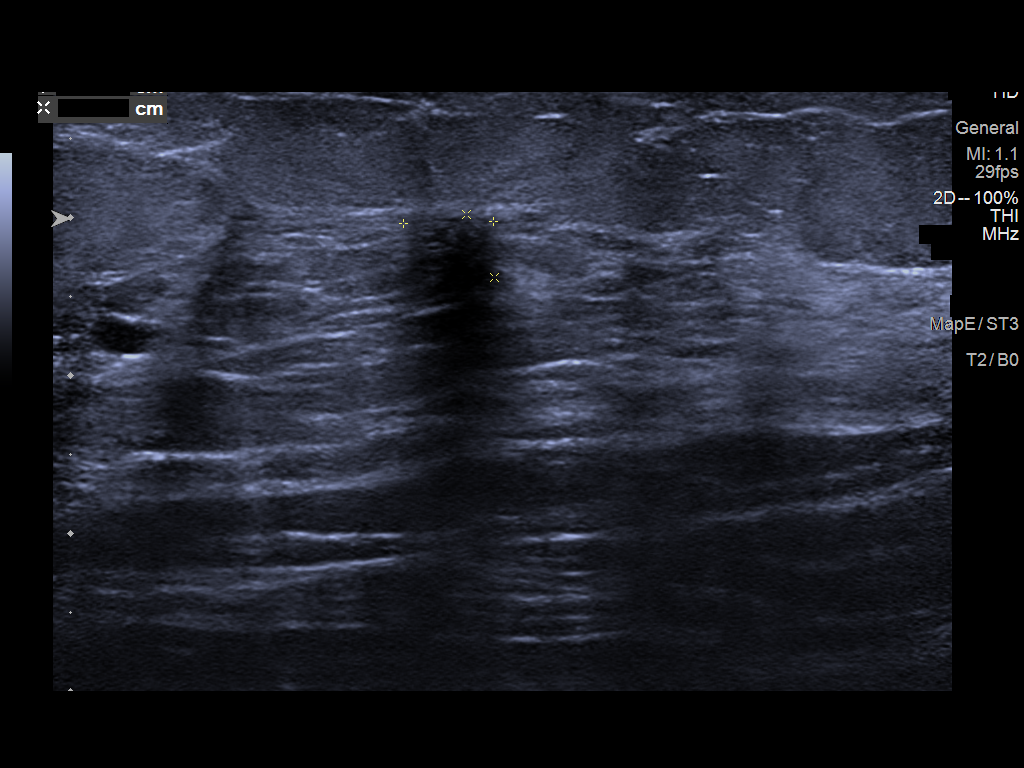
[im 4/5]
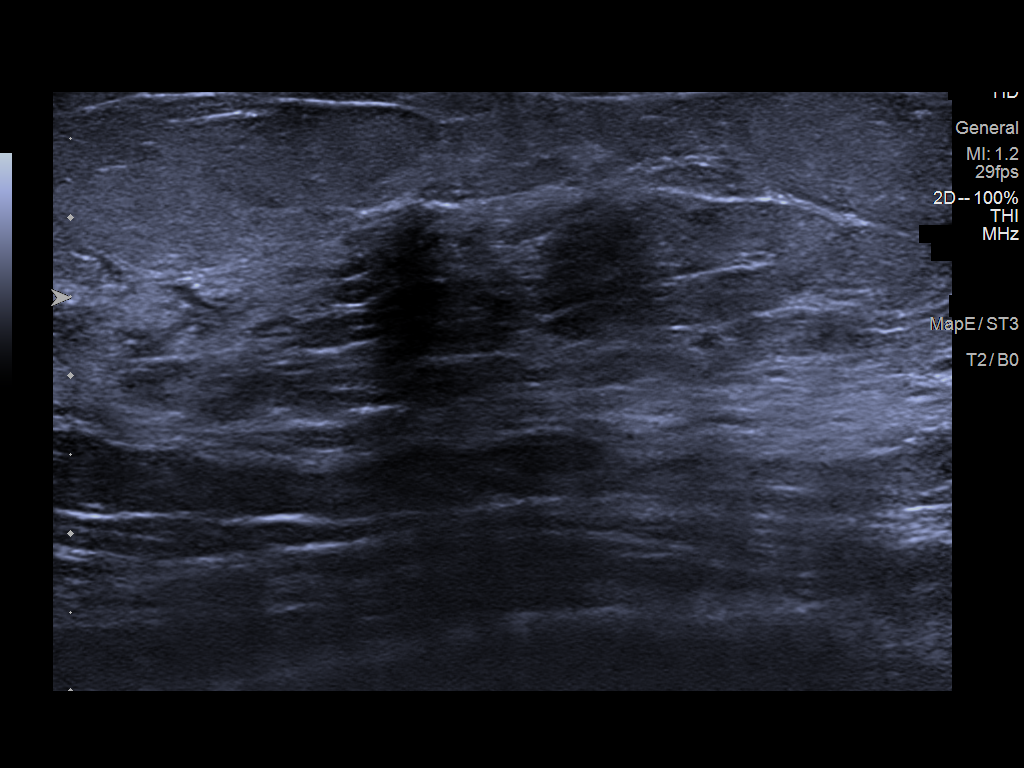
[im 5/5]
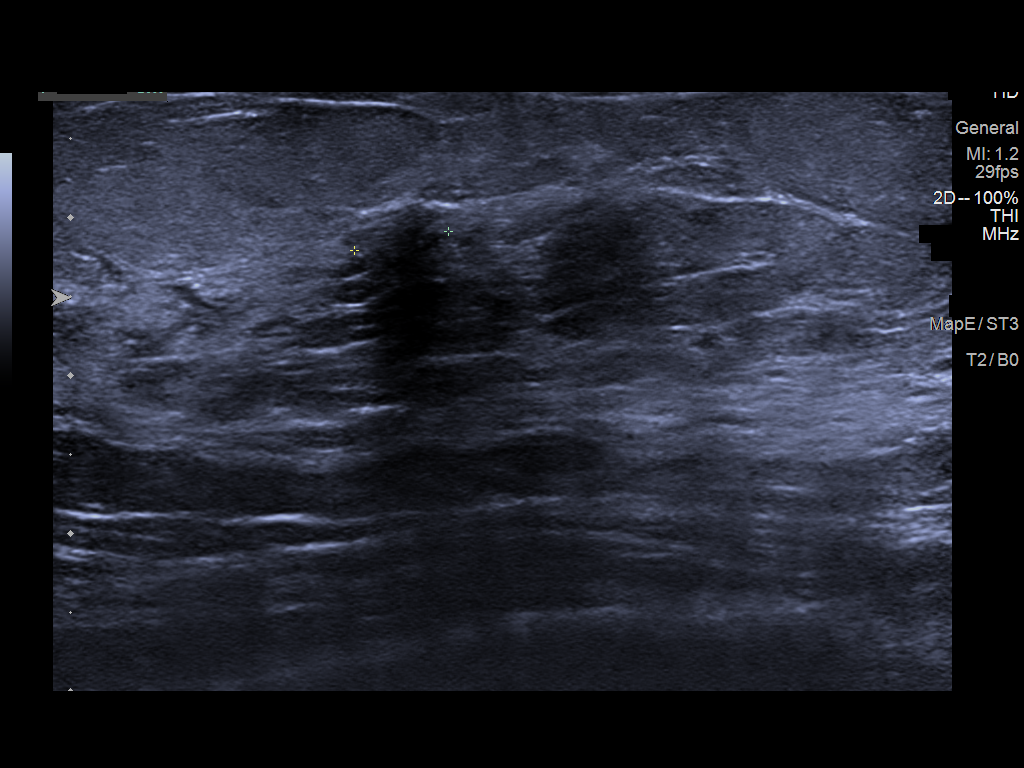

[5 of 5 positions shown; findings below may reference images not displayed]

ACR Breast Density Category c: The breast tissue is heterogeneously
dense, which may obscure small masses.
FINDINGS: Additional imaging demonstrates a persistent small focal area of
architectural distortion within the retroareolar slightly lateral
right breast, demonstrated best on the spot compression CC
tomosynthesis images.

Mammographic images were processed with CAD.

Targeted ultrasound is performed, showing a 6 x 4 x 6 mm irregular
hypoechoic mass with posterior acoustic shadowing at the focal area
of distortion right breast 11 o'clock position 1 cm from the nipple.

No right axillary adenopathy.
IMPRESSION: Suspicious irregular mass at the site of focal distortion within the
right breast 11 o'clock position.

RECOMMENDATION:
Ultrasound-guided core needle biopsy right breast mass 11 o'clock
position.

I have discussed the findings and recommendations with the patient.
If applicable, a reminder letter will be sent to the patient
regarding the next appointment.

BI-RADS CATEGORY  4: Suspicious.

## 2022-01-12 ENCOUNTER — Other Ambulatory Visit: Payer: Self-pay

## 2022-01-12 ENCOUNTER — Other Ambulatory Visit: Payer: Self-pay | Admitting: Physician Assistant

## 2022-01-12 ENCOUNTER — Ambulatory Visit
Admission: RE | Admit: 2022-01-12 | Discharge: 2022-01-12 | Disposition: A | Discharge status: Home / Self Care | Payer: Medicare Other | Source: Ambulatory Visit | Attending: Physician Assistant | Admitting: Physician Assistant

## 2022-01-12 DIAGNOSIS — R109 Unspecified abdominal pain: Secondary | ICD-10-CM

## 2022-01-15 ENCOUNTER — Other Ambulatory Visit: Payer: Self-pay | Admitting: Internal Medicine

## 2022-01-15 DIAGNOSIS — Z1231 Encounter for screening mammogram for malignant neoplasm of breast: Secondary | ICD-10-CM

## 2022-03-09 ENCOUNTER — Ambulatory Visit
Admission: RE | Admit: 2022-03-09 | Discharge: 2022-03-09 | Disposition: A | Discharge status: Home / Self Care | Payer: Medicare Other | Source: Ambulatory Visit | Attending: Internal Medicine | Admitting: Internal Medicine

## 2022-03-09 DIAGNOSIS — Z1231 Encounter for screening mammogram for malignant neoplasm of breast: Secondary | ICD-10-CM | POA: Diagnosis present

## 2022-11-08 ENCOUNTER — Other Ambulatory Visit: Payer: Self-pay | Admitting: Family Medicine

## 2022-11-08 DIAGNOSIS — M5410 Radiculopathy, site unspecified: Secondary | ICD-10-CM

## 2022-11-23 ENCOUNTER — Ambulatory Visit
Admission: RE | Admit: 2022-11-23 | Discharge: 2022-11-23 | Disposition: A | Discharge status: Home / Self Care | Payer: BLUE CROSS/BLUE SHIELD | Source: Ambulatory Visit | Attending: Family Medicine | Admitting: Family Medicine

## 2022-11-23 ENCOUNTER — Other Ambulatory Visit: Payer: Medicare Other

## 2022-11-23 DIAGNOSIS — M5410 Radiculopathy, site unspecified: Secondary | ICD-10-CM

## 2022-12-03 ENCOUNTER — Other Ambulatory Visit: Payer: Self-pay | Admitting: Internal Medicine

## 2022-12-03 DIAGNOSIS — R1032 Left lower quadrant pain: Secondary | ICD-10-CM

## 2022-12-19 ENCOUNTER — Ambulatory Visit
Admission: RE | Admit: 2022-12-19 | Discharge: 2022-12-19 | Disposition: A | Discharge status: Home / Self Care | Payer: Medicare Other | Source: Ambulatory Visit | Attending: Internal Medicine | Admitting: Internal Medicine

## 2022-12-19 DIAGNOSIS — R1032 Left lower quadrant pain: Secondary | ICD-10-CM | POA: Insufficient documentation

## 2022-12-19 MED ORDER — IOHEXOL 300 MG/ML  SOLN
80.0000 mL | Freq: Once | INTRAMUSCULAR | Status: AC | PRN
  Administered 2022-12-19: 80 mL via INTRAVENOUS

## 2022-12-31 NOTE — Progress Notes (Signed)
01/01/2023 8:53 PM   Pearletha Forge [birth year] [id number]  Referring provider: Marguarite Arbour, MD [address] [facility] [place],  [place] [address]  Urological history 1.  High risk hematuria -Non-smoker -Completed workup in 2019 with no worrisome findings -No complaint of gross hematuria -UA 3-10 RBC's   2. OAB -Continue factors of age, vaginal atrophy, lumbar disc disease and hypertension -PVR 0 mL   Chief Complaint  Patient presents with   Follow-up    HPI: 82 yo female with a history of hematuria and frequency who presents today after incidental finding of right hydronephrosis with her daughter, Dedra Skeens, available by phone.   She underwent an MRI of her lumbar spine on November 23, 2022 for low back pain rating down her right leg associated with numbness in the lower leg distal to the knee since October.  It was noted for enlarging bilateral renal collecting systems with the right side with possible right hydronephrosis with an asymmetric enlargement of the right ureter continuing down to the pelvis.  A contrast CT was then ordered by her PCP which noted mild distention of the bilateral renal pelves  and proximal ureter with no evidence of an obstructing calculus or mass or lesion.  There is normal excretion of the contrast without frank hydronephrosis.  She did have a urinalysis back in March 2023 at her PCPs office with 10 RBCs.  She has no urinary complaints.  Patient denies any modifying or aggravating factors.  Patient denies any gross hematuria, dysuria or suprapubic/flank pain.  Patient denies any fevers, chills, nausea or vomiting.  Her urinalysis today the color is yellow clear, specific gravity less than 1.005, 1+ blood, pH 6.5, trace leukocyte, 6-10 WBCs, 3-10 RBCs and moderate bacteria.  PVR 0 mL   PMH: Past Medical History:  Diagnosis Date   Arthritis    Atrophic vaginitis    Chronic kidney disease    Coronary artery disease    GERD  (gastroesophageal reflux disease)    Hematuria    microscopic   Hepatitis    Hyperlipidemia    Hypertension    Kidney stone    Peripheral vascular disease (HCC)    Portal vein thrombosis     Surgical History: Past Surgical History:  Procedure Laterality Date   BREAST BIOPSY Right 04/27/2020   [place] Bx, Coil Clip, BENIGN MAMMARY PARENCHYMA WITH FIBROCYSTIC  AND FOCAL APOCRINE    CERVICAL SPINE SURGERY     ruptured disc   patient states never any surgery for this   COLONOSCOPY WITH PROPOFOL N/A 10/12/2015   Procedure: COLONOSCOPY WITH PROPOFOL;  Surgeon: Scot Jun, MD;  Location[facility] ENDOSCOPY;  Service: Endoscopy;  Laterality: N/A;   HEEL SPUR SURGERY     LIP REPAIR N/A    TONSILLECTOMY      Home Medications:  Allergies as of 01/01/2023       Reactions   Avelox [moxifloxacin Hcl In Nacl]    Bextra [valdecoxib]    Chlorzoxazone    Fosamax [alendronate Sodium]    Guaifenesin & Derivatives    Lodine [etodolac]    Methocarbamol Other (See Comments)   GI upset   Penicillins    Prednisone    Other reaction(s): Other (See Comments) flushing        Medication List        Accurate as of January 01, 2023 11:59 PM. If you have any questions, ask your nurse or doctor.  acetaminophen 325 MG tablet Commonly known as: TYLENOL Take 650 mg by mouth every 8 (eight) hours as needed.   CALCIUM PO Take by mouth.   celecoxib 200 MG capsule Commonly known as: CELEBREX Take 200 mg by mouth daily.   cyanocobalamin 1000 MCG tablet Take 100 mcg by mouth daily.   cyclobenzaprine 5 MG tablet Commonly known as: FLEXERIL Take 5 mg by mouth 3 (three) times daily as needed for muscle spasms.   cyclobenzaprine 10 MG tablet Commonly known as: FLEXERIL Take by mouth.   Dialyvite Vitamin D 5000 125 MCG (5000 UT) capsule Generic drug: Cholecalciferol Take 5,000 Units by mouth daily.   gabapentin 100 MG capsule Commonly known as: NEURONTIN Take 100 mg by mouth  3 (three) times daily.   ibandronate 150 MG tablet Commonly known as: BONIVA Take 150 mg by mouth every 30 (thirty) days.   loratadine 10 MG tablet Commonly known as: CLARITIN Take 10 mg by mouth daily.   losartan 50 MG tablet Commonly known as: COZAAR Take 50 mg by mouth daily.   nebivolol 10 MG tablet Commonly known as: BYSTOLIC Take by mouth.   omega-3 acid ethyl esters 1 g capsule Commonly known as: LOVAZA Take 1 g by mouth 2 (two) times daily.   pantoprazole 40 MG tablet Commonly known as: PROTONIX Take 40 mg by mouth daily.   Propylene Glycol 0.6 % Soln Place 1 drop into both eyes daily.   rosuvastatin 5 MG tablet Commonly known as: CRESTOR Take 5 mg by mouth daily.   valsartan 320 MG tablet Commonly known as: DIOVAN Take 320 mg by mouth daily.   Vitamin D (Ergocalciferol) 50000 units Caps Take 1 capsule by mouth 2 (two) times a week.   warfarin 2 MG tablet Commonly known as: COUMADIN Take 2 mg by mouth daily.        Allergies:  Allergies  Allergen Reactions   Avelox [Moxifloxacin Hcl In Nacl]    Bextra [Valdecoxib]    Chlorzoxazone    Fosamax [Alendronate Sodium]    Guaifenesin & Derivatives    Lodine [Etodolac]    Methocarbamol Other (See Comments)    GI upset   Penicillins    Prednisone     Other reaction(s): Other (See Comments) flushing    Family History: Family History  Problem Relation Age of Onset   Arrhythmia Mother        pacemaker placed   Transient ischemic attack Father    Prostate cancer Brother    Bladder Cancer Neg Hx    Kidney cancer Neg Hx    Breast cancer Neg Hx     Social History:  reports that she has never smoked. She has never used smokeless tobacco. She reports that she does not drink alcohol and does not use drugs.  ROS: For pertinent review of systems please refer to history of present illness  Physical Exam: BP (!) 156/85   Pulse 77   Ht 5\' 3"  (1.6 m)   Wt 150 lb (68 kg)   BMI 26.57 kg/m    Constitutional:  Well nourished. Alert and oriented, No acute distress. HEENT: [place] AT, moist mucus membranes.  Trachea midline Cardiovascular: No clubbing, cyanosis, or edema. Respiratory: Normal respiratory effort, no increased work of breathing. Neurologic: Grossly intact, no focal deficits, moving all 4 extremities. Psychiatric: Normal mood and affect.    Laboratory Data: Serum creatinine (11/2022) 1.0 I have reviewed the labs.  Pertinent imaging BLADDER SCAN AMB NON-IMAGING Order: [id number] Status: Final result  Visible to patient: No (scheduled for 01/01/2023  3:02 PM)     Next appt: None     Dx: Microscopic hematuria   0 Result Notes    Component 14:02  SCA Result 0 ml          Last Resulted: 01/01/23 14:02           Assessment & Plan:    1. Microscopic hematuria -Explained that the contrast images that were performed on December 19, 2022 are not the appropriate studies to evaluate microscopic hematuria and that a CT urogram is indicated for the further evaluation of high risk hematuria -We discussed her options going forward being performing an in office cystoscopy and if there were any findings during the cystoscopy that would require further follow-up in the OR, we could perform bilateral retrogrades at that time and if the cystoscopy was negative, they could decide to proceed with the CT urogram  2. Mild distention of the bilateral renal pelves and proximal ureters -Explained that the recent CT scan did not identify an etiology for the findings on her CT scan -Serum creatinine in January was normal -today's BMP pending -Pending findings on cystoscopy, she may choose no further workup of this incidental finding secondary to her advanced age and comorbidities, proceed with CT urogram or follow kidney function and only intervene if renal function should worsen  -the patient and daughter have indicated to me that they would like to proceed with the CT urogram if  cysto is negative because she has been having micro heme for a long time   Return for cysto for micro heme .  Cloretta Ned  [facility] [address] [place], [place] [address] [phone]

## 2023-01-01 ENCOUNTER — Ambulatory Visit (INDEPENDENT_AMBULATORY_CARE_PROVIDER_SITE_OTHER): Payer: Medicare Other | Admitting: Urology

## 2023-01-01 VITALS — BP 156/85 | HR 77 | Ht 63.0 in | Wt 150.0 lb

## 2023-01-01 DIAGNOSIS — R3129 Other microscopic hematuria: Secondary | ICD-10-CM | POA: Diagnosis not present

## 2023-01-01 DIAGNOSIS — R350 Frequency of micturition: Secondary | ICD-10-CM

## 2023-01-01 DIAGNOSIS — R93429 Abnormal radiologic findings on diagnostic imaging of unspecified kidney: Secondary | ICD-10-CM

## 2023-01-01 LAB — MICROSCOPIC EXAMINATION

## 2023-01-01 LAB — URINALYSIS, COMPLETE
Bilirubin, UA: NEGATIVE
Glucose, UA: NEGATIVE
Ketones, UA: NEGATIVE
Nitrite, UA: NEGATIVE
Protein,UA: NEGATIVE
Specific Gravity, UA: <1.005 — ABNORMAL LOW (ref 1.005–1.030)
Urobilinogen, Ur: 0.2 mg/dL (ref 0.2–1.0)
pH, UA: 6.5 (ref 5.0–7.5)

## 2023-01-01 LAB — BLADDER SCAN AMB NON-IMAGING: SCA Result: 0

## 2023-01-01 NOTE — Patient Instructions (Addendum)
We are going to do the cystoscopy first.  The cystoscopy is done in the office and only looks at the bladder.  If we find something in the bladder that needs to be biopsied, we will then go to the operating room and perform the biopsies and then do bilateral retrogrades (shoot dye up the ureters during the procedure) to make sure the ureters are okay.  If the cystoscopy is clear, we then can decide on going forward with the CT urogram to for the appropriate work up.

## 2023-01-02 ENCOUNTER — Telehealth: Payer: Self-pay | Admitting: Family Medicine

## 2023-01-02 LAB — BASIC METABOLIC PANEL
BUN/Creatinine Ratio: 11 — ABNORMAL LOW (ref 12–28)
BUN: 13 mg/dL (ref 8–27)
CO2: 23 mmol/L (ref 20–29)
Calcium: 9.3 mg/dL (ref 8.7–10.3)
Chloride: 94 mmol/L — ABNORMAL LOW (ref 96–106)
Creatinine, Ser: 1.17 mg/dL — ABNORMAL HIGH (ref 0.57–1.00)
Glucose: 104 mg/dL — ABNORMAL HIGH (ref 70–99)
Potassium: 4.2 mmol/L (ref 3.5–5.2)
Sodium: 132 mmol/L — ABNORMAL LOW (ref 134–144)
eGFR: 47 mL/min/{1.73_m2} — ABNORMAL LOW (ref >59)

## 2023-01-02 NOTE — Telephone Encounter (Signed)
Patient notified and voiced understanding.

## 2023-01-02 NOTE — Telephone Encounter (Signed)
-----   Message from Nori Riis, PA-C sent at 01/02/2023  8:17 AM EST ----- Please let Mary Buckley know that her blood work is stable.  I would like for her to keep well hydrated (urine is pale yellow) and take tylenol instead of NSAIDS for any aches and pains to protect her kidneys.

## 2023-01-03 ENCOUNTER — Encounter: Payer: Self-pay | Admitting: Urology

## 2023-01-05 LAB — CULTURE, URINE COMPREHENSIVE

## 2023-01-09 ENCOUNTER — Other Ambulatory Visit: Payer: Self-pay | Admitting: Internal Medicine

## 2023-01-09 DIAGNOSIS — Z1231 Encounter for screening mammogram for malignant neoplasm of breast: Secondary | ICD-10-CM

## 2023-01-15 ENCOUNTER — Other Ambulatory Visit: Payer: Medicare Other | Admitting: Urology

## 2023-01-30 ENCOUNTER — Ambulatory Visit (INDEPENDENT_AMBULATORY_CARE_PROVIDER_SITE_OTHER): Payer: Medicare Other | Admitting: Urology

## 2023-01-30 ENCOUNTER — Encounter: Payer: Self-pay | Admitting: Urology

## 2023-01-30 VITALS — BP 124/79 | HR 81 | Ht 63.0 in | Wt 150.0 lb

## 2023-01-30 DIAGNOSIS — R3129 Other microscopic hematuria: Secondary | ICD-10-CM | POA: Diagnosis not present

## 2023-01-30 MED ORDER — LIDOCAINE HCL URETHRAL/MUCOSAL 2 % EX GEL
1.0000 | Freq: Once | CUTANEOUS | Status: AC
  Administered 2023-01-30: 1 via URETHRAL

## 2023-01-30 MED ORDER — CEPHALEXIN 250 MG PO CAPS
500.0000 mg | ORAL_CAPSULE | Freq: Once | ORAL | Status: AC
  Administered 2023-01-30: 500 mg via ORAL

## 2023-01-30 NOTE — Progress Notes (Signed)
Cystoscopy Procedure Note:  Indication: Microscopic hematuria  Set up by Zara Council, PA for cystoscopy today.  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    Keflex was given for prophylaxis  After informed consent and discussion of the procedure and its risks, Mary Buckley was positioned and prepped in the standard fashion. Cystoscopy was performed with a flexible cystoscope. The urethra, bladder neck and entire bladder was visualized in a standard fashion.  The ureteral orifices were visualized in their normal location and orientation.  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  Imaging: CT abdomen and pelvis with contrast with mild dilation of the renal pelvises and proximal ureters, likely physiologic in nature, renal function unchanged from baseline over the last 10 years  Findings: Normal cystoscopy  Assessment and Plan: Follow-up with urology as needed  Nickolas Madrid, MD 01/30/2023

## 2023-02-01 ENCOUNTER — Other Ambulatory Visit: Payer: Medicare Other | Admitting: Urology

## 2023-03-02 ENCOUNTER — Emergency Department
Admission: EM | Admit: 2023-03-02 | Discharge: 2023-03-02 | Disposition: A | Discharge status: Home / Self Care | Payer: Medicare Other | Attending: Emergency Medicine | Admitting: Emergency Medicine

## 2023-03-02 ENCOUNTER — Emergency Department: Payer: Medicare Other

## 2023-03-02 DIAGNOSIS — I129 Hypertensive chronic kidney disease with stage 1 through stage 4 chronic kidney disease, or unspecified chronic kidney disease: Secondary | ICD-10-CM | POA: Diagnosis not present

## 2023-03-02 DIAGNOSIS — K921 Melena: Secondary | ICD-10-CM | POA: Insufficient documentation

## 2023-03-02 DIAGNOSIS — Z7901 Long term (current) use of anticoagulants: Secondary | ICD-10-CM | POA: Diagnosis not present

## 2023-03-02 DIAGNOSIS — I2510 Atherosclerotic heart disease of native coronary artery without angina pectoris: Secondary | ICD-10-CM | POA: Diagnosis not present

## 2023-03-02 DIAGNOSIS — Z79899 Other long term (current) drug therapy: Secondary | ICD-10-CM | POA: Insufficient documentation

## 2023-03-02 DIAGNOSIS — R197 Diarrhea, unspecified: Secondary | ICD-10-CM

## 2023-03-02 DIAGNOSIS — R0781 Pleurodynia: Secondary | ICD-10-CM | POA: Insufficient documentation

## 2023-03-02 DIAGNOSIS — R55 Syncope and collapse: Secondary | ICD-10-CM | POA: Diagnosis not present

## 2023-03-02 DIAGNOSIS — N189 Chronic kidney disease, unspecified: Secondary | ICD-10-CM | POA: Insufficient documentation

## 2023-03-02 DIAGNOSIS — A0811 Acute gastroenteropathy due to Norwalk agent: Secondary | ICD-10-CM | POA: Diagnosis not present

## 2023-03-02 LAB — GASTROINTESTINAL PANEL BY PCR, STOOL (REPLACES STOOL CULTURE)

## 2023-03-02 LAB — CBC WITH DIFFERENTIAL/PLATELET
Abs Immature Granulocytes: 0.02 10*3/uL (ref 0.00–0.07)
Basophils Absolute: 0 10*3/uL (ref 0.0–0.1)
Basophils Relative: 0 %
Eosinophils Absolute: 0.1 10*3/uL (ref 0.0–0.5)
Eosinophils Relative: 1 %
HCT: 38.1 % (ref 36.0–46.0)
Hemoglobin: 12.9 g/dL (ref 12.0–15.0)
Immature Granulocytes: 0 %
Lymphocytes Relative: 9 %
Lymphs Abs: 0.6 10*3/uL — ABNORMAL LOW (ref 0.7–4.0)
MCH: 32.4 pg (ref 26.0–34.0)
MCHC: 33.9 g/dL (ref 30.0–36.0)
MCV: 95.7 fL (ref 80.0–100.0)
Monocytes Absolute: 0.4 10*3/uL (ref 0.1–1.0)
Monocytes Relative: 7 %
Neutro Abs: 5.4 10*3/uL (ref 1.7–7.7)
Neutrophils Relative %: 83 %
Platelets: 188 10*3/uL (ref 150–400)
RBC: 3.98 MIL/uL (ref 3.87–5.11)
RDW: 13.3 % (ref 11.5–15.5)
WBC: 6.5 10*3/uL (ref 4.0–10.5)
nRBC: 0 % (ref 0.0–0.2)

## 2023-03-02 LAB — URINALYSIS, ROUTINE W REFLEX MICROSCOPIC
Bacteria, UA: NONE SEEN
Bilirubin Urine: NEGATIVE
Glucose, UA: NEGATIVE mg/dL
Ketones, ur: NEGATIVE mg/dL
Leukocytes,Ua: NEGATIVE
Nitrite: NEGATIVE
Protein, ur: NEGATIVE mg/dL
Specific Gravity, Urine: 1.015 (ref 1.005–1.030)
pH: 6 (ref 5.0–8.0)

## 2023-03-02 LAB — COMPREHENSIVE METABOLIC PANEL
ALT: 27 U/L (ref 0–44)
AST: 39 U/L (ref 15–41)
Albumin: 4.4 g/dL (ref 3.5–5.0)
Alkaline Phosphatase: 37 U/L — ABNORMAL LOW (ref 38–126)
Anion gap: 9 (ref 5–15)
BUN: 12 mg/dL (ref 8–23)
CO2: 21 mmol/L — ABNORMAL LOW (ref 22–32)
Calcium: 8.3 mg/dL — ABNORMAL LOW (ref 8.9–10.3)
Chloride: 101 mmol/L (ref 98–111)
Creatinine, Ser: 0.95 mg/dL (ref 0.44–1.00)
GFR, Estimated: >60 mL/min (ref >60)
Glucose, Bld: 109 mg/dL — ABNORMAL HIGH (ref 70–99)
Potassium: 3.4 mmol/L — ABNORMAL LOW (ref 3.5–5.1)
Sodium: 131 mmol/L — ABNORMAL LOW (ref 135–145)
Total Bilirubin: 0.9 mg/dL (ref 0.3–1.2)
Total Protein: 7.4 g/dL (ref 6.5–8.1)

## 2023-03-02 LAB — PROTIME-INR
INR: 2.6 — ABNORMAL HIGH (ref 0.8–1.2)
Prothrombin Time: 27.2 seconds — ABNORMAL HIGH (ref 11.4–15.2)

## 2023-03-02 LAB — C DIFFICILE QUICK SCREEN W PCR REFLEX
C Diff antigen: NEGATIVE
C Diff interpretation: NOT DETECTED
C Diff toxin: NEGATIVE

## 2023-03-02 LAB — LIPASE, BLOOD: Lipase: 24 U/L (ref 11–51)

## 2023-03-02 MED ORDER — POTASSIUM CHLORIDE CRYS ER 20 MEQ PO TBCR
40.0000 meq | EXTENDED_RELEASE_TABLET | Freq: Once | ORAL | Status: AC
  Administered 2023-03-02: 40 meq via ORAL
  Filled 2023-03-02: qty 2

## 2023-03-02 MED ORDER — IOHEXOL 300 MG/ML  SOLN
100.0000 mL | Freq: Once | INTRAMUSCULAR | Status: AC | PRN
  Administered 2023-03-02: 100 mL via INTRAVENOUS

## 2023-03-02 MED ORDER — SODIUM CHLORIDE 0.9 % IV BOLUS (SEPSIS)
1000.0000 mL | Freq: Once | INTRAVENOUS | Status: AC
  Administered 2023-03-02: 1000 mL via INTRAVENOUS

## 2023-03-02 NOTE — ED Provider Notes (Signed)
Procedures     ----------------------------------------- 9:31 AM on 03/02/2023 -----------------------------------------  Patient feeling much.  Vital signs are essentially normal.  Workup is very reassuring and notable for GI panel positive for norovirus.  Repeat abdominal exam is benign.  Patient is well-hydrated.  She can continue to take Imodium at home, stable for discharge.    Sharman Cheek, MD 03/02/23 [phone]

## 2023-03-02 NOTE — ED Provider Notes (Signed)
[facility] Provider Note    Event Date/Time   First MD Initiated Contact with Patient 03/02/23 [phone]     (approximate)   History   Diarrhea   HPI  Mary Buckley is a 82 y.o. female with history of hypertension, hyperlipidemia, peripheral vascular disease, DVT on Coumadin, coronary artery disease who presents to the emergency department with diarrhea that has been ongoing since Wednesday, April 17.  She states that she has had multiple episodes and this morning when she had diarrhea she saw small amount of bright red blood on the toilet paper.  No melena.  No vomiting.  She did have a syncopal event on Wednesday and fell onto the carpet onto her left side.  States she was seen in [place] [facility] and had x-rays of her ribs, EKG but did not come to the emergency department for CT of her head despite being on Coumadin.  She denies any head or neck pain, back pain.  Still complaining of some left sided rib pain but states she does not know the results of her x-rays.  No recent antibiotic use prior to the diarrhea starting but states since seeing [facility] they did start her on Cipro.  No sick contacts or recent travel.  She denies previous abdominal surgery.   History provided by patient, EMS.    Past Medical History:  Diagnosis Date   Arthritis    Atrophic vaginitis    Chronic kidney disease    Coronary artery disease    GERD (gastroesophageal reflux disease)    Hematuria    microscopic   Hepatitis    Hyperlipidemia    Hypertension    Kidney stone    Peripheral vascular disease (HCC)    Portal vein thrombosis     Past Surgical History:  Procedure Laterality Date   BREAST BIOPSY Right 04/27/2020   [place] Bx, Coil Clip, BENIGN MAMMARY PARENCHYMA WITH FIBROCYSTIC  AND FOCAL APOCRINE    CERVICAL SPINE SURGERY     ruptured disc   patient states never any surgery for this   COLONOSCOPY WITH PROPOFOL N/A 10/12/2015   Procedure: COLONOSCOPY WITH  PROPOFOL;  Surgeon: Scot Jun, MD;  Location: [facility] ENDOSCOPY;  Service: Endoscopy;  Laterality: N/A;   HEEL SPUR SURGERY     LIP REPAIR N/A    TONSILLECTOMY      MEDICATIONS:  Prior to Admission medications   Medication Sig Start Date End Date Taking? Authorizing Provider  acetaminophen (TYLENOL) 325 MG tablet Take 650 mg by mouth every 8 (eight) hours as needed.    [provider]  CALCIUM PO Take by mouth.    [provider]  celecoxib (CELEBREX) 200 MG capsule Take 200 mg by mouth daily.    [provider]  Cholecalciferol (DIALYVITE VITAMIN D 5000) 125 MCG (5000 UT) capsule Take 5,000 Units by mouth daily.    [provider]  cyanocobalamin 1000 MCG tablet Take 100 mcg by mouth daily.    [provider]  cyclobenzaprine (FLEXERIL) 10 MG tablet Take by mouth. 12/03/22   [provider]  gabapentin (NEURONTIN) 100 MG capsule Take 100 mg by mouth 3 (three) times daily. 12/04/22   [provider]  loratadine (CLARITIN) 10 MG tablet Take 10 mg by mouth daily.    [provider]  losartan (COZAAR) 50 MG tablet Take 50 mg by mouth daily.    [provider]  nebivolol (BYSTOLIC) 10 MG tablet Take by mouth. 01/09/17  [provider]  omega-3 acid ethyl esters (LOVAZA) 1 G capsule Take 1 g by mouth 2 (two) times daily.    [provider]  pantoprazole (PROTONIX) 40 MG tablet Take 40 mg by mouth daily.    [provider]  Propylene Glycol 0.6 % SOLN Place 1 drop into both eyes daily.    [provider]  rosuvastatin (CRESTOR) 5 MG tablet Take 5 mg by mouth daily.    [provider]  valsartan (DIOVAN) 320 MG tablet Take 320 mg by mouth daily. 11/29/22   [provider]  Vitamin D, Ergocalciferol, 50000 units CAPS Take 1 capsule by mouth 2 (two) times a week. 12/31/22   [provider]  warfarin (COUMADIN) 2 MG tablet Take 2 mg by mouth daily. , Sun,  Wed, Sat , Mon, Tues, Thur, Fri    [provider]    Physical Exam   Triage Vital Signs: ED Triage Vitals  Enc Vitals Group     BP 03/02/23 0602 (!) 154/82     Pulse Rate 03/02/23 0607 76     Resp 03/02/23 0607 18     Temp 03/02/23 0607 97.9 F (36.6 C)     Temp Source 03/02/23 0607 Oral     SpO2 03/02/23 0607 93 %     Weight 03/02/23 0607 152 lb 5.4 oz (69.1 kg)     Height 03/02/23 0607  (1.575 m)     Head Circumference --      Peak Flow --      Pain Score --      Pain Loc --      Pain Edu? --      Excl. in GC? --     Most recent vital signs: Vitals:   03/02/23 0607 03/02/23 0630  BP:  (!) 158/75  Pulse:  74  Resp:  15  Temp: 97.9 F (36.6 C)   SpO2:  91%    CONSTITUTIONAL: Alert, responds appropriately to questions. Well-appearing; well-nourished, elderly HEAD: Normocephalic, atraumatic EYES: Conjunctivae clear, pupils appear equal, sclera nonicteric ENT: normal nose; dry appearing mucous membranes NECK: Supple, normal ROM, no midline spinal tenderness or step-off or deformity CARD: RRR; S1 and S2 appreciated, some tenderness over the left chest wall RESP: Normal chest excursion without splinting or tachypnea; breath sounds clear and equal bilaterally; no wheezes, no rhonchi, no rales, no hypoxia or respiratory distress, speaking full sentences ABD/GI: Non-distended; soft, non-tender, no rebound, no guarding, no peritoneal signs BACK: The back appears normal, no midline spinal tenderness or step-off or deformity EXT: Normal ROM in all joints; no deformity noted, no edema SKIN: Normal color for age and race; warm; no rash on exposed skin NEURO: Moves all extremities equally, normal speech, no facial asymmetry PSYCH: The patient's mood and manner are appropriate.   ED Results / Procedures / Treatments   LABS: (all labs ordered are listed, but only abnormal results are displayed) Labs Reviewed  CBC WITH DIFFERENTIAL/PLATELET - Abnormal;  Notable for the following components:      Result Value   Lymphs Abs 0.6 (*)    All other components within normal limits  COMPREHENSIVE METABOLIC PANEL - Abnormal; Notable for the following components:   Sodium 131 (*)    Potassium 3.4 (*)    CO2 21 (*)    Glucose, Bld 109 (*)    Calcium 8.3 (*)    Alkaline Phosphatase 37 (*)    All other components within normal limits  GASTROINTESTINAL  PANEL BY PCR, STOOL (REPLACES STOOL CULTURE)  C DIFFICILE QUICK SCREEN W PCR REFLEX    LIPASE, BLOOD  URINALYSIS, ROUTINE W REFLEX MICROSCOPIC  PROTIME-INR     EKG:  EKG Interpretation  Date/Time:  Saturday March 02 2023 06:05:40 EDT Ventricular Rate:  75 PR Interval:  186 QRS Duration: 94 QT Interval:  444 QTC Calculation: 496 R Axis:   -28 Text Interpretation: Sinus rhythm Low voltage, precordial leads LVH by voltage Anterior Q waves, possibly due to LVH Confirmed by Rochele Raring [id number]) on 03/02/2023 6:17:45 AM         RADIOLOGY: My personal review and interpretation of imaging:    I have personally reviewed all radiology reports.   No results found.   PROCEDURES:  Critical Care performed: No     .1-3 Lead EKG Interpretation  Performed by: Gem Conkle, Layla Maw, DO Authorized by: Delonta Yohannes, Layla Maw, DO     Interpretation: normal     ECG rate:  76   ECG rate assessment: normal     Rhythm: sinus rhythm     Ectopy: none     Conduction: normal       IMPRESSION / MDM / ASSESSMENT AND PLAN / ED COURSE  I reviewed the triage vital signs and the nursing notes.    Patient here with complaints of diarrhea now seeing a small amount of blood when she wiped tonight.  Also complains of syncope that occurred 4 days ago with a fall to the ground while on Coumadin.  The patient is on the cardiac monitor to evaluate for evidence of arrhythmia and/or significant heart rate changes.   DIFFERENTIAL DIAGNOSIS (includes but not limited to):   Infectious diarrhea, C. difficile, colitis,  dehydration, UTI, intracranial hemorrhage, skull fracture, cervical spine fracture, rib fracture, pulmonary contusion   Patient's presentation is most consistent with acute presentation with potential threat to life or bodily function.   PLAN: Will obtain CT of the head and cervical spine given her fall on Coumadin.  Will also obtain CT of the chest to evaluate for rib fractures.  She states she had a chest x-ray however I cannot see the results from this on Wednesday from [facility].  EKG is nonischemic.  I do not think that this left-sided chest wall pain is her anginal equivalent.  Will check blood counts, electrolytes, renal function, LFTs and lipase.  Will obtain CT of the abdomen pelvis.  Will obtain stool cultures.  Will give IV fluids.   MEDICATIONS GIVEN IN ED: Medications  potassium chloride SA (KLOR-CON M) CR tablet 40 mEq (has no administration in time range)  sodium chloride 0.9 % bolus 1,000 mL (1,000 mLs Intravenous New Bag/Given 03/02/23 0612)  iohexol (OMNIPAQUE) 300 MG/ML solution 100 mL (100 mLs Intravenous Contrast Given 03/02/23 0645)     ED COURSE: Labs show no leukocytosis, normal hemoglobin.  Potassium 3.4.  Will give oral replacement.  Otherwise electrolytes, creatinine, LFTs and lipase normal.  CTs, urine, stool studies pending.  Signed out to oncoming ED physician.   CONSULTS: Pending further workup   OUTSIDE RECORDS REVIEWED: Reviewed recent records from [facility].       FINAL CLINICAL IMPRESSION(S) / ED DIAGNOSES   Final diagnoses:  Diarrhea, unspecified type  Syncope, unspecified syncope type     Rx / DC Orders   ED Discharge Orders     None        Note:  This document was prepared using Dragon voice recognition software and may  include unintentional dictation errors.   Kimila Papaleo, Layla Maw, DO 03/02/23 [phone]

## 2023-03-02 NOTE — ED Triage Notes (Signed)
Patient C/O diarrhea that began on Wednesday night. Patient also states that she had a syncopal episode after leaving the restroom on early Thursday morning.

## 2023-03-11 ENCOUNTER — Ambulatory Visit
Admission: RE | Admit: 2023-03-11 | Discharge: 2023-03-11 | Disposition: A | Discharge status: Home / Self Care | Payer: Medicare Other | Source: Ambulatory Visit | Attending: Internal Medicine | Admitting: Internal Medicine

## 2023-03-11 DIAGNOSIS — Z1231 Encounter for screening mammogram for malignant neoplasm of breast: Secondary | ICD-10-CM | POA: Diagnosis present

## 2023-09-17 ENCOUNTER — Encounter: Payer: Self-pay | Admitting: Internal Medicine

## 2023-09-24 ENCOUNTER — Encounter: Payer: Self-pay | Admitting: Internal Medicine

## 2023-09-30 ENCOUNTER — Encounter: Payer: Self-pay | Admitting: Internal Medicine

## 2023-10-01 ENCOUNTER — Ambulatory Visit: Payer: Medicare Other | Admitting: Anesthesiology

## 2023-10-01 ENCOUNTER — Encounter
Admission: RE | Disposition: A | Discharge status: Home / Self Care | Payer: Self-pay | Source: Home / Self Care | Attending: Internal Medicine

## 2023-10-01 ENCOUNTER — Ambulatory Visit
Admission: RE | Admit: 2023-10-01 | Discharge: 2023-10-01 | Disposition: A | Discharge status: Home / Self Care | Payer: Medicare Other | Attending: Internal Medicine | Admitting: Internal Medicine

## 2023-10-01 DIAGNOSIS — K5730 Diverticulosis of large intestine without perforation or abscess without bleeding: Secondary | ICD-10-CM | POA: Diagnosis not present

## 2023-10-01 DIAGNOSIS — I2510 Atherosclerotic heart disease of native coronary artery without angina pectoris: Secondary | ICD-10-CM | POA: Diagnosis not present

## 2023-10-01 DIAGNOSIS — Z791 Long term (current) use of non-steroidal anti-inflammatories (NSAID): Secondary | ICD-10-CM | POA: Diagnosis not present

## 2023-10-01 DIAGNOSIS — Z7901 Long term (current) use of anticoagulants: Secondary | ICD-10-CM | POA: Insufficient documentation

## 2023-10-01 DIAGNOSIS — N189 Chronic kidney disease, unspecified: Secondary | ICD-10-CM | POA: Insufficient documentation

## 2023-10-01 DIAGNOSIS — K921 Melena: Secondary | ICD-10-CM | POA: Insufficient documentation

## 2023-10-01 DIAGNOSIS — I129 Hypertensive chronic kidney disease with stage 1 through stage 4 chronic kidney disease, or unspecified chronic kidney disease: Secondary | ICD-10-CM | POA: Insufficient documentation

## 2023-10-01 DIAGNOSIS — E785 Hyperlipidemia, unspecified: Secondary | ICD-10-CM | POA: Insufficient documentation

## 2023-10-01 DIAGNOSIS — K640 First degree hemorrhoids: Secondary | ICD-10-CM | POA: Diagnosis not present

## 2023-10-01 DIAGNOSIS — Z86718 Personal history of other venous thrombosis and embolism: Secondary | ICD-10-CM | POA: Insufficient documentation

## 2023-10-01 DIAGNOSIS — K219 Gastro-esophageal reflux disease without esophagitis: Secondary | ICD-10-CM | POA: Diagnosis not present

## 2023-10-01 DIAGNOSIS — I739 Peripheral vascular disease, unspecified: Secondary | ICD-10-CM | POA: Diagnosis not present

## 2023-10-01 DIAGNOSIS — Z79899 Other long term (current) drug therapy: Secondary | ICD-10-CM | POA: Insufficient documentation

## 2023-10-01 HISTORY — PX: COLONOSCOPY WITH PROPOFOL: SHX5780

## 2023-10-01 SURGERY — COLONOSCOPY WITH PROPOFOL
Anesthesia: General

## 2023-10-01 MED ORDER — PROPOFOL 10 MG/ML IV BOLUS
INTRAVENOUS | Status: DC | PRN
  Administered 2023-10-01: 70 mg via INTRAVENOUS

## 2023-10-01 MED ORDER — SODIUM CHLORIDE 0.9 % IV SOLN
INTRAVENOUS | Status: DC
Start: 2023-10-01 — End: 2023-10-01

## 2023-10-01 MED ORDER — EPHEDRINE SULFATE (PRESSORS) 50 MG/ML IJ SOLN
INTRAMUSCULAR | Status: DC | PRN
  Administered 2023-10-01: 5 mg via INTRAVENOUS

## 2023-10-01 MED ORDER — PROPOFOL 500 MG/50ML IV EMUL
INTRAVENOUS | Status: DC | PRN
  Administered 2023-10-01: 140 ug/kg/min via INTRAVENOUS

## 2023-10-01 MED ORDER — LIDOCAINE HCL (CARDIAC) PF 100 MG/5ML IV SOSY
PREFILLED_SYRINGE | INTRAVENOUS | Status: DC | PRN
  Administered 2023-10-01: 60 mg via INTRAVENOUS

## 2023-10-01 NOTE — Anesthesia Preprocedure Evaluation (Signed)
Anesthesia Evaluation  Patient identified by MRN, date of birth, ID band Patient awake    Reviewed: Allergy & Precautions, H&P , NPO status , Patient's Chart, lab work & pertinent test results, reviewed documented beta blocker date and time   Airway Mallampati: II   Neck ROM: full    Dental  (+) Poor Dentition   Pulmonary neg pulmonary ROS   Pulmonary exam normal        Cardiovascular Exercise Tolerance: Poor hypertension, On Medications + CAD  Normal cardiovascular exam Rhythm:regular Rate:Normal     Neuro/Psych negative neurological ROS  negative psych ROS   GI/Hepatic ,GERD  Medicated,,(+) Hepatitis -  Endo/Other  negative endocrine ROS    Renal/GU Renal disease  negative genitourinary   Musculoskeletal   Abdominal   Peds  Hematology negative hematology ROS (+)   Anesthesia Other Findings Past Medical History: No date: Arthritis No date: Atrophic vaginitis No date: Chronic kidney disease No date: Coronary artery disease No date: GERD (gastroesophageal reflux disease) No date: Hematuria     Comment:  microscopic No date: Hepatitis No date: Hyperlipidemia No date: Hypertension No date: Kidney stone No date: Peripheral vascular disease (HCC) No date: Portal vein thrombosis Past Surgical History: 04/27/2020: BREAST BIOPSY; Right     Comment:  [place] Bx, Coil Clip, BENIGN MAMMARY PARENCHYMA WITH               FIBROCYSTIC AND FOCAL APOCRINE  No date: CERVICAL SPINE SURGERY     Comment:  ruptured disc   patient states never any surgery for               this 10/12/2015: COLONOSCOPY WITH PROPOFOL; N/A     Comment:  Procedure: COLONOSCOPY WITH PROPOFOL;  Surgeon: Scot Jun, MD;  Location: [facility] ENDOSCOPY;  Service:               Endoscopy;  Laterality: N/A; No date: HEEL SPUR SURGERY No date: LIP REPAIR; N/A No date: TONSILLECTOMY BMI    Body Mass Index: 29.69 kg/m      Reproductive/Obstetrics negative OB ROS                             Anesthesia Physical Anesthesia Plan  ASA: 3  Anesthesia Plan: General   Post-op Pain Management:    Induction:   PONV Risk Score and Plan:   Airway Management Planned:   Additional Equipment:   Intra-op Plan:   Post-operative Plan:   Informed Consent: I have reviewed the patients History and Physical, chart, labs and discussed the procedure including the risks, benefits and alternatives for the proposed anesthesia with the patient or authorized representative who has indicated his/her understanding and acceptance.     Dental Advisory Given  Plan Discussed with: CRNA  Anesthesia Plan Comments:        Anesthesia Quick Evaluation

## 2023-10-01 NOTE — Transfer of Care (Signed)
Immediate Anesthesia Transfer of Care Note  Patient: Mary Buckley  Procedure(s) Performed: COLONOSCOPY WITH PROPOFOL  Patient Location: Endoscopy Unit  Anesthesia Type:General  Level of Consciousness: drowsy  Airway & Oxygen Therapy: Patient Spontanous Breathing  Post-op Assessment: Report given to RN  Post vital signs: stable  Last Vitals:  Vitals Value Taken Time  BP    Temp    Pulse    Resp    SpO2      Last Pain:  Vitals:   10/01/23 0801  TempSrc: Temporal  PainSc: 0-No pain         Complications: No notable events documented.

## 2023-10-01 NOTE — Interval H&P Note (Signed)
History and Physical Interval Note:  10/01/2023 8:44 AM  Mary Buckley  has presented today for surgery, with the diagnosis of 569.3 (ICD-9-CM) - K62.5 (ICD-10-CM) - Rectal bleeding.  The various methods of treatment have been discussed with the patient and family. After consideration of risks, benefits and other options for treatment, the patient has consented to  Procedure(s): COLONOSCOPY WITH PROPOFOL (N/A) as a surgical intervention.  The patient's history has been reviewed, patient examined, no change in status, stable for surgery.  I have reviewed the patient's chart and labs.  Questions were answered to the patient's satisfaction.     [place], [place]

## 2023-10-01 NOTE — H&P (Signed)
Outpatient short stay form Pre-procedure 10/01/2023 8:41 AM Mary Buckley, M.D.  Primary Physician: Aram Beecham, M.D.  Reason for visit:  Hematochezia  History of present illness:  Ms. Mary Buckley reports in mid April 2024 she had norovirus and had diarrhea with repetitive wiping. While she was having the norovirus symptoms she developed some rectal bleeding with diarrhea. The rectal bleeding resolved at that time after a few days. She reports doing Hemoccult studies July 2024 that were negative. Last week she had return of rectal bleeding and saw some fresh blood blood with some with wiping. She feels that the stool was loose the days that she had the rectal bleeding. She reports other times she can have constipation and straining but does not feel that she has as much rectal bleeding with straining compared to when having looser stools. Some days she is having pebble-like stools. She has started a fiber supplement as of 3 days ago. She denies significant abdominal pain. She reports a long history of diverticulitis many years ago but is avoided seeds and has not needed antibiotics or had diverticulitis symptoms recently.  She is on Protonix 40 mg daily which controls her heartburn/reflux well. She denies any nausea vomiting. She reports her appetite is good and her weight is stable.  She has been on Coumadin for many years since she had a portal vein thrombosis  She denies otc NSAIDs. She takes Celebrex w/ food. She denies tobacco and alcohol.     Current Facility-Administered Medications:    0.9 %  sodium chloride infusion, , Intravenous, Continuous, [place], Boykin Nearing, MD, Last Rate: 20 mL/hr at 10/01/23 0810, New Bag at 10/01/23 0810  Medications Prior to Admission  Medication Sig Dispense Refill Last Dose   gabapentin (NEURONTIN) 100 MG capsule Take 100 mg by mouth 3 (three) times daily.   09/30/2023   isosorbide mononitrate (IMDUR) 30 MG 24 hr tablet Take 15 mg by mouth daily.    10/01/2023   loratadine (CLARITIN) 10 MG tablet Take 10 mg by mouth daily.   09/30/2023   losartan (COZAAR) 50 MG tablet Take 50 mg by mouth daily.   09/30/2023   nebivolol (BYSTOLIC) 10 MG tablet Take by mouth.   10/01/2023   omega-3 acid ethyl esters (LOVAZA) 1 G capsule Take 1 g by mouth 2 (two) times daily.   Past Week   pantoprazole (PROTONIX) 40 MG tablet Take 40 mg by mouth daily.   09/30/2023   rosuvastatin (CRESTOR) 5 MG tablet Take 5 mg by mouth daily.   09/30/2023   valsartan (DIOVAN) 320 MG tablet Take 320 mg by mouth daily.   10/01/2023   acetaminophen (TYLENOL) 325 MG tablet Take 650 mg by mouth every 8 (eight) hours as needed.      CALCIUM PO Take by mouth.      celecoxib (CELEBREX) 200 MG capsule Take 200 mg by mouth daily.      Cholecalciferol (DIALYVITE VITAMIN D 5000) 125 MCG (5000 UT) capsule Take 5,000 Units by mouth daily.      cyanocobalamin 1000 MCG tablet Take 100 mcg by mouth daily.      cyclobenzaprine (FLEXERIL) 10 MG tablet Take by mouth.      Propylene Glycol 0.6 % SOLN Place 1 drop into both eyes daily.      Vitamin D, Ergocalciferol, 50000 units CAPS Take 1 capsule by mouth 2 (two) times a week.      warfarin (COUMADIN) 2 MG tablet Take 2 mg by mouth daily. 1mg ,  Sun, Wed, Sat 2mg , Mon, Tues, Thur, Fri   09/26/2023     Allergies  Allergen Reactions   Amlodipine    Avelox [Moxifloxacin Hcl In Nacl]    Bextra [Valdecoxib]    Chlorzoxazone    Fosamax [Alendronate Sodium]    Guaifenesin & Derivatives    Lodine [Etodolac]    Methocarbamol Other (See Comments)    GI upset   Penicillins    Prednisone     Other reaction(s): Other (See Comments) flushing     Past Medical History:  Diagnosis Date   Arthritis    Atrophic vaginitis    Chronic kidney disease    Coronary artery disease    GERD (gastroesophageal reflux disease)    Hematuria    microscopic   Hepatitis    Hyperlipidemia    Hypertension    Kidney stone    Peripheral vascular disease  (HCC)    Portal vein thrombosis     Review of systems:  Otherwise negative.    Physical Exam  Gen: Alert, oriented. Appears stated age.  HEENT: [place]/AT. PERRLA. Lungs: CTA, no wheezes. CV: RR nl S1, S2. Abd: soft, benign, no masses. BS+ Ext: No edema. Pulses 2+    Planned procedures: Proceed with colonoscopy off coumadin. The patient understands the nature of the planned procedure, indications, risks, alternatives and potential complications including but not limited to bleeding, infection, perforation, damage to internal organs and possible oversedation/side effects from anesthesia. The patient agrees and gives consent to proceed.  Please refer to procedure notes for findings, recommendations and patient disposition/instructions.     Sarita Hakanson K. Norma Buckley, M.D. Gastroenterology 10/01/2023  8:41 AM

## 2023-10-01 NOTE — Anesthesia Postprocedure Evaluation (Signed)
Anesthesia Post Note  Patient: Mary Buckley  Procedure(s) Performed: COLONOSCOPY WITH PROPOFOL  Patient location during evaluation: PACU Anesthesia Type: General Level of consciousness: awake and alert Pain management: pain level controlled Vital Signs Assessment: post-procedure vital signs reviewed and stable Respiratory status: spontaneous breathing, nonlabored ventilation, respiratory function stable and patient connected to nasal cannula oxygen Cardiovascular status: blood pressure returned to baseline and stable Postop Assessment: no apparent nausea or vomiting Anesthetic complications: no   No notable events documented.   Last Vitals:  Vitals:   10/01/23 0958 10/01/23 1000  BP:  104/75  Pulse: 76   Resp: (!) 21 (!) 21  Temp:    SpO2: 95% 98%    Last Pain:  Vitals:   10/01/23 1000  TempSrc:   PainSc: 0-No pain                 Yevette Edwards

## 2023-10-01 NOTE — Op Note (Signed)
[facility]astroenterology Patient Name: Mary Buckley Procedure Date: 10/01/2023 9:18 AM MRN: [id number] Account #: [id number] Date of Birth: 1940-11-25 Admit Type: Outpatient Age: 82 Room: [facility] ENDO ROOM 3 Gender: Female Note Status: Finalized Instrument Name: Colonoscope [id number] Procedure:             Colonoscopy Indications:           Hematochezia Providers:             Boykin Nearing. Norma Fredrickson MD, MD Referring MD:          Duane Lope. Judithann Sheen, MD (Referring MD) Medicines:             Propofol per Anesthesia Complications:         No immediate complications. Estimated blood loss: None. Procedure:             Pre-Anesthesia Assessment:                        - The risks and benefits of the procedure and the                         sedation options and risks were discussed with the                         patient. All questions were answered and informed                         consent was obtained.                        - Patient identification and proposed procedure were                         verified prior to the procedure by the nurse. The                         procedure was verified in the procedure room.                        - After reviewing the risks and benefits, the patient                         was deemed in satisfactory condition to undergo the                         procedure.                        - ASA Grade Assessment: III - A patient with severe                         systemic disease.                        After obtaining informed consent, the colonoscope was                         passed under direct vision. Throughout the procedure,  the patient's blood pressure, pulse, and oxygen                         saturations were monitored continuously. The                         Colonoscope was introduced through the anus and                         advanced to the the cecum, identified by appendiceal                          orifice and ileocecal valve. The patient tolerated the                         procedure well. The quality of the bowel preparation                         was good. [Anatomical Structures] photographed. The                         colonoscopy was somewhat difficult due to significant                         looping. Successful completion of the procedure was                         aided by applying abdominal pressure. Findings:      The perianal and digital rectal examinations were normal. Pertinent       negatives include normal sphincter tone and no palpable rectal lesions.      Non-bleeding internal hemorrhoids were found during retroflexion. The       hemorrhoids were Grade I (internal hemorrhoids that do not prolapse).      Multiple large-mouthed and medium-mouthed diverticula were found in the       sigmoid colon. There was no evidence of diverticular bleeding.      The exam was otherwise without abnormality. Impression:            - Non-bleeding internal hemorrhoids.                        - Moderate diverticulosis in the sigmoid colon. There                         was no evidence of diverticular bleeding.                        - The examination was otherwise normal.                        - No specimens collected. Recommendation:        - Patient has a contact number available for                         emergencies. The signs and symptoms of potential                         delayed complications were discussed with the patient.  Return to normal activities tomorrow. Written                         discharge instructions were provided to the patient.                        - High fiber diet.                        - Continue present medications.                        - You do NOT require further colon cancer screening                         measures (Annual stool testing (i.e. hemoccult, FIT,                         cologuard), sigmoidoscopy,  colonoscopy or CT                         colonography). You should share this recommendation                         with your Primary Care provider.                        - Return to GI office PRN.                        - The findings and recommendations were discussed with                         the patient. Procedure Code(s):     --- Professional ---                        [phone], Colonoscopy, flexible; diagnostic, including                         collection of specimen(s) by brushing or washing, when                         performed (separate procedure) Diagnosis Code(s):     --- Professional ---                        K57.30, Diverticulosis of large intestine without                         perforation or abscess without bleeding                        K92.1, Melena (includes Hematochezia)                        K64.0, First degree hemorrhoids CPT copyright 2022 [employer]. All rights reserved. The codes documented in this report are preliminary and upon coder review may  be revised to meet current compliance requirements. Stanton Kidney MD, MD 10/01/2023 9:50:46 AM This report has been signed electronically. Number of Addenda: 0 Note Initiated On: 10/01/2023 9:18 AM  Scope Withdrawal Time: 0 hours 2 minutes 6 seconds  Total Procedure Duration: 0 hours 11 minutes 47 seconds  Estimated Blood Loss:  Estimated blood loss: none.      [facility]

## 2023-10-02 ENCOUNTER — Encounter: Payer: Self-pay | Admitting: Internal Medicine

## 2023-11-21 ENCOUNTER — Other Ambulatory Visit: Payer: Self-pay | Admitting: Internal Medicine

## 2023-11-21 DIAGNOSIS — Z1231 Encounter for screening mammogram for malignant neoplasm of breast: Secondary | ICD-10-CM

## 2023-12-10 ENCOUNTER — Emergency Department: Payer: Medicare Other

## 2023-12-10 ENCOUNTER — Inpatient Hospital Stay
Admission: EM | Admit: 2023-12-10 | Discharge: 2023-12-18 | DRG: 480 | Disposition: A | Discharge status: Skilled Nursing Facility | Payer: Medicare Other | Attending: Internal Medicine | Admitting: Internal Medicine

## 2023-12-10 ENCOUNTER — Other Ambulatory Visit: Payer: Self-pay

## 2023-12-10 DIAGNOSIS — Z87442 Personal history of urinary calculi: Secondary | ICD-10-CM

## 2023-12-10 DIAGNOSIS — M810 Age-related osteoporosis without current pathological fracture: Secondary | ICD-10-CM | POA: Diagnosis present

## 2023-12-10 DIAGNOSIS — Z7901 Long term (current) use of anticoagulants: Secondary | ICD-10-CM

## 2023-12-10 DIAGNOSIS — Y92009 Unspecified place in unspecified non-institutional (private) residence as the place of occurrence of the external cause: Secondary | ICD-10-CM | POA: Diagnosis not present

## 2023-12-10 DIAGNOSIS — W19XXXA Unspecified fall, initial encounter: Secondary | ICD-10-CM | POA: Diagnosis present

## 2023-12-10 DIAGNOSIS — E785 Hyperlipidemia, unspecified: Secondary | ICD-10-CM | POA: Diagnosis present

## 2023-12-10 DIAGNOSIS — G8929 Other chronic pain: Secondary | ICD-10-CM | POA: Diagnosis present

## 2023-12-10 DIAGNOSIS — E663 Overweight: Secondary | ICD-10-CM | POA: Diagnosis present

## 2023-12-10 DIAGNOSIS — I10 Essential (primary) hypertension: Secondary | ICD-10-CM | POA: Diagnosis not present

## 2023-12-10 DIAGNOSIS — K219 Gastro-esophageal reflux disease without esophagitis: Secondary | ICD-10-CM | POA: Diagnosis present

## 2023-12-10 DIAGNOSIS — Z8042 Family history of malignant neoplasm of prostate: Secondary | ICD-10-CM

## 2023-12-10 DIAGNOSIS — I2510 Atherosclerotic heart disease of native coronary artery without angina pectoris: Secondary | ICD-10-CM | POA: Diagnosis present

## 2023-12-10 DIAGNOSIS — S72302A Unspecified fracture of shaft of left femur, initial encounter for closed fracture: Secondary | ICD-10-CM | POA: Diagnosis not present

## 2023-12-10 DIAGNOSIS — S7292XA Unspecified fracture of left femur, initial encounter for closed fracture: Secondary | ICD-10-CM | POA: Diagnosis present

## 2023-12-10 DIAGNOSIS — I951 Orthostatic hypotension: Secondary | ICD-10-CM | POA: Diagnosis present

## 2023-12-10 DIAGNOSIS — E871 Hypo-osmolality and hyponatremia: Secondary | ICD-10-CM | POA: Diagnosis present

## 2023-12-10 DIAGNOSIS — Z79899 Other long term (current) drug therapy: Secondary | ICD-10-CM

## 2023-12-10 DIAGNOSIS — K5900 Constipation, unspecified: Secondary | ICD-10-CM | POA: Diagnosis not present

## 2023-12-10 DIAGNOSIS — N1831 Chronic kidney disease, stage 3a: Secondary | ICD-10-CM | POA: Diagnosis present

## 2023-12-10 DIAGNOSIS — S72322A Displaced transverse fracture of shaft of left femur, initial encounter for closed fracture: Principal | ICD-10-CM | POA: Diagnosis present

## 2023-12-10 DIAGNOSIS — Z85828 Personal history of other malignant neoplasm of skin: Secondary | ICD-10-CM

## 2023-12-10 DIAGNOSIS — S72002A Fracture of unspecified part of neck of left femur, initial encounter for closed fracture: Secondary | ICD-10-CM | POA: Diagnosis present

## 2023-12-10 DIAGNOSIS — I130 Hypertensive heart and chronic kidney disease with heart failure and stage 1 through stage 4 chronic kidney disease, or unspecified chronic kidney disease: Secondary | ICD-10-CM | POA: Diagnosis present

## 2023-12-10 DIAGNOSIS — R0902 Hypoxemia: Secondary | ICD-10-CM | POA: Diagnosis not present

## 2023-12-10 DIAGNOSIS — I81 Portal vein thrombosis: Secondary | ICD-10-CM | POA: Diagnosis present

## 2023-12-10 DIAGNOSIS — I739 Peripheral vascular disease, unspecified: Secondary | ICD-10-CM | POA: Diagnosis present

## 2023-12-10 DIAGNOSIS — Z79890 Hormone replacement therapy (postmenopausal): Secondary | ICD-10-CM

## 2023-12-10 DIAGNOSIS — Z6829 Body mass index (BMI) 29.0-29.9, adult: Secondary | ICD-10-CM | POA: Diagnosis not present

## 2023-12-10 DIAGNOSIS — I5032 Chronic diastolic (congestive) heart failure: Secondary | ICD-10-CM | POA: Diagnosis present

## 2023-12-10 LAB — CBC WITH DIFFERENTIAL/PLATELET
Abs Immature Granulocytes: 0.02 10*3/uL (ref 0.00–0.07)
Basophils Absolute: 0 10*3/uL (ref 0.0–0.1)
Basophils Relative: 0 %
Eosinophils Absolute: 0 10*3/uL (ref 0.0–0.5)
Eosinophils Relative: 0 %
HCT: 30.9 % — ABNORMAL LOW (ref 36.0–46.0)
Hemoglobin: 10.9 g/dL — ABNORMAL LOW (ref 12.0–15.0)
Immature Granulocytes: 0 %
Lymphocytes Relative: 8 %
Lymphs Abs: 0.6 10*3/uL — ABNORMAL LOW (ref 0.7–4.0)
MCH: 33.7 pg (ref 26.0–34.0)
MCHC: 35.3 g/dL (ref 30.0–36.0)
MCV: 95.7 fL (ref 80.0–100.0)
Monocytes Absolute: 0.5 10*3/uL (ref 0.1–1.0)
Monocytes Relative: 6 %
Neutro Abs: 6.5 10*3/uL (ref 1.7–7.7)
Neutrophils Relative %: 86 %
Platelets: 205 10*3/uL (ref 150–400)
RBC: 3.23 MIL/uL — ABNORMAL LOW (ref 3.87–5.11)
RDW: 13.4 % (ref 11.5–15.5)
WBC: 7.6 10*3/uL (ref 4.0–10.5)
nRBC: 0 % (ref 0.0–0.2)

## 2023-12-10 LAB — BASIC METABOLIC PANEL
Anion gap: 17 — ABNORMAL HIGH (ref 5–15)
BUN: 16 mg/dL (ref 8–23)
CO2: 23 mmol/L (ref 22–32)
Calcium: 9.4 mg/dL (ref 8.9–10.3)
Chloride: 88 mmol/L — ABNORMAL LOW (ref 98–111)
Creatinine, Ser: 1.05 mg/dL — ABNORMAL HIGH (ref 0.44–1.00)
GFR, Estimated: 53 mL/min — ABNORMAL LOW (ref >60)
Glucose, Bld: 127 mg/dL — ABNORMAL HIGH (ref 70–99)
Potassium: 4.3 mmol/L (ref 3.5–5.1)
Sodium: 128 mmol/L — ABNORMAL LOW (ref 135–145)

## 2023-12-10 LAB — PROTIME-INR
INR: 1.9 — ABNORMAL HIGH (ref 0.8–1.2)
Prothrombin Time: 22.3 s — ABNORMAL HIGH (ref 11.4–15.2)

## 2023-12-10 LAB — BRAIN NATRIURETIC PEPTIDE: B Natriuretic Peptide: 55.9 pg/mL (ref 0.0–100.0)

## 2023-12-10 LAB — APTT: aPTT: 37 s — ABNORMAL HIGH (ref 24–36)

## 2023-12-10 MED ORDER — SODIUM CHLORIDE 1 G PO TABS: 1.0000 g | ORAL_TABLET | Freq: Two times a day (BID) | ORAL | Status: DC

## 2023-12-10 MED ORDER — ISOSORBIDE MONONITRATE ER 30 MG PO TB24
15.0000 mg | ORAL_TABLET | Freq: Every day | ORAL | Status: DC
  Administered 2023-12-11 – 2023-12-12 (×2): 15 mg via ORAL
  Filled 2023-12-10 (×2): qty 1

## 2023-12-10 MED ORDER — HYDRALAZINE HCL 20 MG/ML IJ SOLN: 5.0000 mg | INTRAMUSCULAR | Status: DC | PRN

## 2023-12-10 MED ORDER — ROSUVASTATIN CALCIUM 5 MG PO TABS
5.0000 mg | ORAL_TABLET | Freq: Two times a day (BID) | ORAL | Status: DC
  Administered 2023-12-11 – 2023-12-18 (×15): 5 mg via ORAL
  Filled 2023-12-10 (×16): qty 1

## 2023-12-10 MED ORDER — ONDANSETRON HCL 4 MG/2ML IJ SOLN
4.0000 mg | Freq: Three times a day (TID) | INTRAMUSCULAR | Status: DC | PRN
  Administered 2023-12-11: 4 mg via INTRAVENOUS
  Filled 2023-12-10: qty 2

## 2023-12-10 MED ORDER — MORPHINE SULFATE (PF) 4 MG/ML IV SOLN
4.0000 mg | Freq: Once | INTRAVENOUS | Status: AC
  Administered 2023-12-10: 4 mg via INTRAVENOUS
  Filled 2023-12-10: qty 1

## 2023-12-10 MED ORDER — MORPHINE SULFATE (PF) 4 MG/ML IV SOLN: 4.0000 mg | INTRAVENOUS | Status: DC | PRN

## 2023-12-10 MED ORDER — MORPHINE SULFATE (PF) 2 MG/ML IV SOLN: 2.0000 mg | INTRAVENOUS | Status: DC | PRN

## 2023-12-10 MED ORDER — LIDOCAINE 5 % EX PTCH
1.0000 | MEDICATED_PATCH | CUTANEOUS | Status: DC
  Administered 2023-12-11 – 2023-12-17 (×8): 1 via TRANSDERMAL
  Filled 2023-12-10 (×8): qty 1

## 2023-12-10 MED ORDER — NEBIVOLOL HCL 10 MG PO TABS
10.0000 mg | ORAL_TABLET | Freq: Every day | ORAL | Status: DC
  Administered 2023-12-11 – 2023-12-12 (×2): 10 mg via ORAL
  Filled 2023-12-10 (×2): qty 1

## 2023-12-10 MED ORDER — MORPHINE SULFATE (PF) 2 MG/ML IV SOLN
1.0000 mg | INTRAVENOUS | Status: DC | PRN
  Administered 2023-12-11: 1 mg via INTRAVENOUS
  Filled 2023-12-10 (×2): qty 1

## 2023-12-10 MED ORDER — CYCLOBENZAPRINE HCL 10 MG PO TABS
5.0000 mg | ORAL_TABLET | Freq: Three times a day (TID) | ORAL | Status: DC | PRN
  Administered 2023-12-12 – 2023-12-17 (×3): 5 mg via ORAL
  Filled 2023-12-10 (×3): qty 1

## 2023-12-10 MED ORDER — HYDRALAZINE HCL 20 MG/ML IJ SOLN: 10.0000 mg | INTRAMUSCULAR | Status: DC | PRN

## 2023-12-10 MED ORDER — PANTOPRAZOLE SODIUM 40 MG PO TBEC
40.0000 mg | DELAYED_RELEASE_TABLET | Freq: Every day | ORAL | Status: DC
  Administered 2023-12-11 – 2023-12-18 (×8): 40 mg via ORAL
  Filled 2023-12-10 (×8): qty 1

## 2023-12-10 MED ORDER — IRBESARTAN 150 MG PO TABS: 300.0000 mg | ORAL_TABLET | Freq: Every day | ORAL | Status: DC

## 2023-12-10 MED ORDER — OXYCODONE-ACETAMINOPHEN 5-325 MG PO TABS
1.0000 | ORAL_TABLET | ORAL | Status: DC | PRN
  Administered 2023-12-11 – 2023-12-13 (×4): 1 via ORAL
  Filled 2023-12-10 (×4): qty 1

## 2023-12-10 MED ORDER — SODIUM CHLORIDE 1 G PO TABS
1.0000 g | ORAL_TABLET | Freq: Two times a day (BID) | ORAL | Status: DC
  Administered 2023-12-11: 1 g via ORAL
  Filled 2023-12-10: qty 1

## 2023-12-10 MED ORDER — POLYVINYL ALCOHOL 1.4 % OP SOLN
1.0000 [drp] | Freq: Every day | OPHTHALMIC | Status: DC
  Administered 2023-12-11 – 2023-12-18 (×8): 1 [drp] via OPHTHALMIC
  Filled 2023-12-10: qty 15

## 2023-12-10 MED ORDER — ACETAMINOPHEN 325 MG PO TABS
650.0000 mg | ORAL_TABLET | Freq: Four times a day (QID) | ORAL | Status: DC | PRN
  Administered 2023-12-11 – 2023-12-18 (×13): 650 mg via ORAL
  Filled 2023-12-10 (×13): qty 2

## 2023-12-10 MED ORDER — SENNOSIDES-DOCUSATE SODIUM 8.6-50 MG PO TABS: 1.0000 | ORAL_TABLET | Freq: Every evening | ORAL | Status: DC | PRN

## 2023-12-10 NOTE — ED Notes (Signed)
Pt SpO2 dropped to 88 - pt placed on 2L [place]. SpO2 improved to 97%.

## 2023-12-10 NOTE — ED Triage Notes (Signed)
Pt arrives via [facility] with CC of mechanical fall (lowered to ground) resulting in L leg shortening with lateral rotation - pulses and cap refill intact at this time. When being lowered to the ground pt heard pop. Did not hit head. Takes coumadin. EMS started 18g IV in L AC and gave Fentanyl total - last dose of at 2100. Pt is A&Ox4 at this time.

## 2023-12-10 NOTE — ED Provider Notes (Signed)
[facility] Provider Note    Event Date/Time   First MD Initiated Contact with Patient 12/10/23 1913     (approximate)   History   Fall   HPI Mary Buckley is a 83 y.o. 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  Patient reportedly had a fall at home.  She states she was up walking with her cane when she felt like she heard a pop in her leg gave out.  Denies injury elsewhere.  Most of her pain is between her thigh and left hip.  Denies hitting head or neck.  No pain elsewhere.  No loss of consciousness.  With EMS they noted her leg to be shortened and externally rotated.  She is on warfarin.     Physical Exam   Triage Vital Signs: ED Triage Vitals  Encounter Vitals Group     BP      Systolic BP Percentile      Diastolic BP Percentile      Pulse      Resp      Temp      Temp src      SpO2      Weight      Height      Head Circumference      Peak Flow      Pain Score      Pain Loc      Pain Education      Exclude from Growth Chart     Most recent vital signs: Vitals:   12/10/23 1923  BP: (!) 152/80  Pulse: 70  Resp: 17  Temp: 98.3 F (36.8 C)  SpO2: 97%    I have reviewed the vital signs. General:  Awake, alert, no acute distress. Head:  Normocephalic, Atraumatic. EENT:  PERRL, EOMI, Oral mucosa pink and moist, Neck is supple. Cardiovascular: Regular rate, 2+ distal pulses. Respiratory:  Normal respiratory effort, symmetrical expansion, no distress.   Extremities: Left lower extremity shortened and externally rotated.  Positive logroll of left leg.  No tenderness palpation throughout right lower extremity or bilateral upper extremities.  No chest wall tenderness palpation.  2+ DP pulse. Neuro:  Alert and oriented.  Interacting appropriately.  Sensation intact throughout bilateral lower extremities and consistent with her baseline. Skin:  Warm, dry, no rash.   Psych:  Appropriate affect.    ED Results / Procedures / Treatments   Labs (all labs ordered are listed, but only abnormal results are displayed) Labs Reviewed  APTT - Abnormal; Notable for the following components:      Result Value   aPTT 37 (*)    All other components within normal limits  PROTIME-INR - Abnormal; Notable for the following components:   Prothrombin Time 22.3 (*)    INR 1.9 (*)    All other components within normal limits  CBC WITH DIFFERENTIAL/PLATELET - Abnormal; Notable for the following components:   RBC 3.23 (*)    Hemoglobin 10.9 (*)    HCT 30.9 (*)    Lymphs Abs 0.6 (*)    All other components within normal limits  BASIC METABOLIC PANEL - Abnormal; Notable for the following components:   Sodium 128 (*)    Chloride 88 (*)    Glucose, Bld 127 (*)    Creatinine, Ser 1.05 (*)    GFR, Estimated 53 (*)    Anion gap 17 (*)    All other components  within normal limits     EKG    RADIOLOGY Independently interpreted x-ray and CT femur showing transverse left femur fracture   PROCEDURES:  Critical Care performed: No  Procedures   MEDICATIONS ORDERED IN ED: Medications  morphine (PF) 2 MG/ML injection 2 mg (has no administration in time range)    Or  morphine (PF) 4 MG/ML injection 4 mg (has no administration in time range)  morphine (PF) 4 MG/ML injection 4 mg (4 mg Intravenous Given 12/10/23 2127)     IMPRESSION / MDM / ASSESSMENT AND PLAN / ED COURSE  I reviewed the triage vital signs and the nursing notes.                              Differential diagnosis includes, but is not limited to, pelvic fracture, femoral neck fracture, femoral shaft fracture  Patient's presentation is most consistent with acute complicated illness / injury requiring diagnostic workup.  Patient is an 83 year old female presenting today with ground-level fall with left hip leg injury.  Exam shows shortened and externally rotated left lower extremity.  Neurovascularly  intact below the injury.  No head injury or loss of consciousness.  X-ray shows transverse left femur shaft fracture.  No other acute traumatic abnormalities.  Discussed case with orthopedics who recommended CT femur to rule out pathologic fracture.  This was unremarkable.  Will admit to hospitalist with planned OR tomorrow.  The patient is on the cardiac monitor to evaluate for evidence of arrhythmia and/or significant heart rate changes. Clinical Course as of 12/10/23 2209  Tue Dec 10, 2023  2016 Dr. Audelia Acton - CT femur to rule out pathologic fracture.  If negative can admit to medicine. Likely plan for OR tomorrow. [DW]    Clinical Course User Index [DW] Janith Lima, MD     FINAL CLINICAL IMPRESSION(S) / ED DIAGNOSES   Final diagnoses:  Closed displaced transverse fracture of shaft of left femur, initial encounter (HCC)  Fall, initial encounter     Rx / DC Orders   ED Discharge Orders     None        Note:  This document was prepared using Dragon voice recognition software and may include unintentional dictation errors.   Janith Lima, MD 12/10/23 [phone]

## 2023-12-10 NOTE — ED Notes (Addendum)
Daughter at bedside - updated on plan of care. Pt taken for xray.

## 2023-12-10 NOTE — Progress Notes (Signed)
Imaging and labs reviewed Patient with displaced midshaft left femur fracture, no signs of pathologic fracture on xrays or CT. Patient will need left femur open reduction internal fixation of the left femur Admit to medicine  Correction of metabolic abnormalities per medicine to optimize for OR INR currently 1.9 on coumadin, would be useful if patient is able to get dose of Vitamin K tonight to help lower further for surgery  NPO after midnight for OR tomorrow Hold chemical [facility] after midnight Plan for surgery tomorrow 12/10/2023 in afternoon Will see patient in morning and discuss the injury, review surgery, and treatments options.  Full consult dictation to follow  Reinaldo Berber MD

## 2023-12-10 NOTE — H&P (Incomplete)
History and Physical    Mary Buckley WUJ:[id number] DOB: Oct 05, 1941 DOA: 12/10/2023  Referring MD/NP/PA:   PCP: Marguarite Arbour, MD   Patient coming from:  The patient is coming from home.     Chief Complaint: fall and left upper leg pain  HPI: Mary Buckley is a 83 y.o. 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  Patient states that she accidentally fell when she was walking at home.  Denies loss of consciousness.  No head or neck injury.  She developed pain in left upper leg and hip area, which is constant, sharp, severe, nonradiating, aggravated by movement.  No chest pain, cough, SOB.  No nausea, vomiting, diarrhea or abdominal pain.  No symptoms of UTI.  Data reviewed independently and ED Course: pt was found to have WBC 7.6, INR 1.9, renal function close to baseline, temperature normal, blood pressure 152/80, heart rate 70, RR 17, oxygen saturation 97% initially, which dropped to 88% after received IV fentanyl in ED.  X-ray and CT scan showed displaced midshaft left femur fracture.  Patient is admitted to telemetry bed as inpatient.  Dr. Audelia Acton of Ortho is consulted.   EKG: I have personally reviewed.  Sinus rhythm, QTc 488, LAD, poor R wave progression, low voltage.  CT-left femur:  Acute displaced and rotated fracture of the proximal femoral diaphysis.   Review of Systems:   General: no fevers, chills, no body weight gain,  has fatigue HEENT: no blurry vision, hearing changes or sore throat Respiratory: no dyspnea, coughing, wheezing CV: no chest pain, no palpitations GI: no nausea, vomiting, abdominal pain, diarrhea, constipation GU: no dysuria, burning on urination, increased urinary frequency, hematuria  Ext: no leg edema Neuro: no unilateral weakness, numbness, or tingling, no  vision change or hearing loss. Has fall Skin: no rash, no skin tear. MSK:  left upper leg and hip pain Heme: No easy bruising.  Travel history: No recent long distant travel.   Allergy:  Allergies  Allergen Reactions   Amlodipine    Avelox [Moxifloxacin Hcl In Nacl]    Bextra [Valdecoxib]    Chlorzoxazone    Fosamax [Alendronate Sodium]    Guaifenesin & Derivatives    Lodine [Etodolac]    Methocarbamol Other (See Comments)    GI upset   Penicillins    Prednisone     Other reaction(s): Other (See Comments) flushing    Past Medical History:  Diagnosis Date   Arthritis    Atrophic vaginitis    Chronic kidney disease    Coronary artery disease    GERD (gastroesophageal reflux disease)    Hematuria    microscopic   Hepatitis    Hyperlipidemia    Hypertension    Kidney stone    Peripheral vascular disease (HCC)    Portal vein thrombosis     Past Surgical History:  Procedure Laterality Date   BREAST BIOPSY Right 04/27/2020   [place] Bx, Coil Clip, BENIGN MAMMARY PARENCHYMA WITH FIBROCYSTIC  AND FOCAL APOCRINE    CERVICAL SPINE SURGERY     ruptured disc   patient states never any surgery for this   COLONOSCOPY WITH PROPOFOL N/A 10/12/2015   Procedure: COLONOSCOPY WITH PROPOFOL;  Surgeon: Scot Jun, MD;  Location: [facility] ENDOSCOPY;  Service: Endoscopy;  Laterality: N/A;   COLONOSCOPY WITH PROPOFOL N/A 10/01/2023   Procedure: COLONOSCOPY WITH  PROPOFOL;  Surgeon: Toledo, Boykin Nearing, MD;  Location: [facility] ENDOSCOPY;  Service: Gastroenterology;  Laterality: N/A;   HEEL SPUR SURGERY     LIP REPAIR N/A    TONSILLECTOMY      Social History:  reports that she has never smoked. She has never used smokeless tobacco. She reports that she does not drink alcohol and does not use drugs.  Family History:  Family History  Problem Relation Age of Onset   Arrhythmia Mother        pacemaker placed   Transient ischemic attack Father    Prostate cancer Brother    Bladder Cancer Neg  Hx    Kidney cancer Neg Hx    Breast cancer Neg Hx      Prior to Admission medications   Medication Sig Start Date End Date Taking? Authorizing Provider  acetaminophen (TYLENOL) 325 MG tablet Take 650 mg by mouth every 8 (eight) hours as needed.    [provider]  CALCIUM PO Take by mouth.    [provider]  celecoxib (CELEBREX) 200 MG capsule Take 200 mg by mouth daily.    [provider]  Cholecalciferol (DIALYVITE VITAMIN D 5000) 125 MCG (5000 UT) capsule Take 5,000 Units by mouth daily.    [provider]  cyanocobalamin 1000 MCG tablet Take 100 mcg by mouth daily.    [provider]  cyclobenzaprine (FLEXERIL) 10 MG tablet Take by mouth. 12/03/22   [provider]  gabapentin (NEURONTIN) 100 MG capsule Take 100 mg by mouth 3 (three) times daily. 12/04/22   [provider]  isosorbide mononitrate (IMDUR) 30 MG 24 hr tablet Take 15 mg by mouth daily.    [provider]  loratadine (CLARITIN) 10 MG tablet Take 10 mg by mouth daily.    [provider]  losartan (COZAAR) 50 MG tablet Take 50 mg by mouth daily.    [provider]  nebivolol (BYSTOLIC) 10 MG tablet Take by mouth. 01/09/17   [provider]  omega-3 acid ethyl esters (LOVAZA) 1 G capsule Take 1 g by mouth 2 (two) times daily.    [provider]  pantoprazole (PROTONIX) 40 MG tablet Take 40 mg by mouth daily.    [provider]  Propylene Glycol 0.6 % SOLN Place 1 drop into both eyes daily.    [provider]  rosuvastatin (CRESTOR) 5 MG tablet Take 5 mg by mouth daily.    [provider]  valsartan (DIOVAN) 320 MG tablet Take 320 mg by mouth daily. 11/29/22   [provider]  Vitamin D, Ergocalciferol, 50000 units CAPS Take 1 capsule by mouth 2 (two) times a week. 12/31/22   [provider]  warfarin (COUMADIN) 2 MG tablet Take 2 mg by mouth daily. 1mg , Sun, Wed, Sat 2mg , Mon,  Tues, Thur, Fri    [provider]    Physical Exam: Vitals:   12/10/23 2212 12/10/23 2216 12/10/23 2300 12/11/23 0101  BP:   (!) 148/96 131/85  Pulse:   70 66  Resp:   15   Temp:   98.3 F (36.8 C) 97.9 F (36.6 C)  TempSrc:   Oral Oral  SpO2: (!) 88% 97% 100% 99%  Weight:      Height:       General: Not in acute distress HEENT:       Eyes: PERRL, EOMI, no jaundice       ENT: No discharge from the ears and nose, no  pharynx injection, no tonsillar enlargement.        Neck: No JVD, no bruit, no mass felt. Heme: No neck lymph node enlargement. Cardiac: S1/S2, RRR, No murmurs, No gallops or rubs. Respiratory: No rales, wheezing, rhonchi or rubs. GI: Soft, nondistended, nontender, no rebound pain, no organomegaly, BS present. GU: No hematuria Ext: No pitting leg edema bilaterally. 1+DP/PT pulse bilaterally. Musculoskeletal: has tenderness in left upper leg. The left leg is externally rotated. Skin: No rashes.  Neuro: Alert, oriented X3, cranial nerves II-XII grossly intact, moves all extremities. Psych: Patient is not psychotic, no suicidal or hemocidal ideation.  Labs on Admission: I have personally reviewed following labs and imaging studies  CBC: Recent Labs  Lab 12/10/23 2123  WBC 7.6  NEUTROABS 6.5  HGB 10.9*  HCT 30.9*  MCV 95.7  PLT 205   Basic Metabolic Panel: Recent Labs  Lab 12/10/23 2123  NA 128*  K 4.3  CL 88*  CO2 23  GLUCOSE 127*  BUN 16  CREATININE 1.05*  CALCIUM 9.4   GFR: Estimated Creatinine Clearance: 35.5 mL/min (A) (by C-G formula based on SCr of 1.05 mg/dL (H)). Liver Function Tests: No results for input(s): "AST", "ALT", "ALKPHOS", "BILITOT", "PROT", "ALBUMIN" in the last 168 hours. No results for input(s): "LIPASE", "AMYLASE" in the last 168 hours. No results for input(s): "AMMONIA" in the last 168 hours. Coagulation Profile: Recent Labs  Lab 12/10/23 2123  INR 1.9*   Cardiac Enzymes: No results for input(s):  "CKTOTAL", "CKMB", "CKMBINDEX", "TROPONINI" in the last 168 hours. BNP (last 3 results) No results for input(s): "PROBNP" in the last 8760 hours. HbA1C: No results for input(s): "HGBA1C" in the last 72 hours. CBG: No results for input(s): "GLUCAP" in the last 168 hours. Lipid Profile: No results for input(s): "CHOL", "HDL", "LDLCALC", "TRIG", "CHOLHDL", "LDLDIRECT" in the last 72 hours. Thyroid Function Tests: No results for input(s): "TSH", "T4TOTAL", "FREET4", "T3FREE", "THYROIDAB" in the last 72 hours. Anemia Panel: No results for input(s): "VITAMINB12", "FOLATE", "FERRITIN", "TIBC", "IRON", "RETICCTPCT" in the last 72 hours. Urine analysis:    Component Value Date/Time   COLORURINE STRAW (A) 03/02/2023 0726   APPEARANCEUR CLEAR (A) 03/02/2023 0726   APPEARANCEUR Clear 01/01/2023 1333   LABSPEC 1.015 03/02/2023 0726   LABSPEC 1.003 10/01/2014 0607   PHURINE 6.0 03/02/2023 0726   GLUCOSEU NEGATIVE 03/02/2023 0726   GLUCOSEU Negative 10/01/2014 0607   HGBUR MODERATE (A) 03/02/2023 0726   BILIRUBINUR NEGATIVE 03/02/2023 0726   BILIRUBINUR Negative 01/01/2023 1333   BILIRUBINUR Negative 10/01/2014 0607   KETONESUR NEGATIVE 03/02/2023 0726   PROTEINUR NEGATIVE 03/02/2023 0726   NITRITE NEGATIVE 03/02/2023 0726   LEUKOCYTESUR NEGATIVE 03/02/2023 0726   LEUKOCYTESUR Trace 10/01/2014 0607   Sepsis Labs: @LABRCNTIP (procalcitonin:4,lacticidven:4) )No results found for this or any previous visit (from the past 240 hours).   Radiological Exams on Admission:   Assessment/Plan Principal Problem:   Femur fracture, left (HCC) Active Problems:   Fall at home, initial encounter   Deep vein thrombosis of portal vein   Benign essential HTN   HLD (hyperlipidemia)   Chronic diastolic CHF (congestive heart failure) (HCC)   Chronic kidney disease, stage 3a (HCC)   Overweight (BMI 25.0-29.9)   Hyponatremia   Assessment and Plan:  Femur fracture, left (HCC):  pt has acute displaced  and rotated fracture of the proximal femoral diaphysis. No neurovascular compromise. Orthopedic surgeon, Dr. Audelia Acton was consulted, planning surgery in tomorrow afternoon  - will admit to tele bed as inpt -  Pain control: prn morphine, percocet and tyleno - When necessary Zofran for nausea -As needed Flexeril for muscle spasm - Lidoderm patch for pain - type and cross - INR/PTT  Fall at home, initial encounter: CT head negative. - PT/OT when able to (not ordered now)  Deep vein thrombosis of portal vein: Patient has been taking Coumadin since 2006.  Patient states that she was on hormone replacement therapy when she was diagnosed with thrombosis.  She stopped taking hormone.  Her INR is 1.9.  Dr. Charlane Ferretti is hoping to reverse INR.  I discussed with the patient about the benefit and risk. She completely understand it.  She chooses to reverse Coumadin with the understanding that this may increase the risk of getting blood clot again. She strongly wants to do the surgery as soon as possible. -Hold Coumadin -Give 2 mg of vitamin K -Check INR daily  Benign essential HTN -IV hydralazine as needed -Hold Diovan due to hyponatremia -Continue Imdur, diastolic -Patient states that she is not taking amlodipine currently  HLD (hyperlipidemia) -Crestor 5 mg twice daily  Chronic diastolic CHF (congestive heart failure) (HCC): 2D echo on 06/12/2023 showed EF 50% with grade 1 diastolic dysfunction.  Patient does not have leg edema or JVD.  BNP normal 55.9.  CHF is compensated. -Watch volume status closely  Chronic kidney disease, stage 3a (HCC): Renal function is close to baseline.  Recent baseline creatinine 0.9-1.0.  Her creatinine is 1.05, BUN 16, GFR 53 -Follow-up with BMP  Overweight (BMI 25.0-29.9): Body weight 68 kg, BMI 29.29 -Exercise and healthy diet -Encourage losing weight  Hyponatremia: Sodium 128.  Mental status normal. -Hold Diovan -IV normal saline 1 L bolus -Fluid  restriction -Sodium chloride tablet 1 g twice daily -Repeat BMP in morning      DVT ppx: SCD  Code Status: Full code   Family Communication:    Yes, patient's daughter   at bed side.       Disposition Plan: Need rehab  Consults called:  Dr. Audelia Acton  Admission status and Level of care: Telemetry Medical: as inpt        Dispo: The patient is from: Home              Anticipated d/c is to: SNF              Anticipated d/c date is: 2 days              Patient currently is not medically stable to d/c.    Severity of Illness:  The appropriate patient status for this patient is INPATIENT. Inpatient status is judged to be reasonable and necessary in order to provide the required intensity of service to ensure the patient's safety. The patient's presenting symptoms, physical exam findings, and initial radiographic and laboratory data in the context of their chronic comorbidities is felt to place them at high risk for further clinical deterioration. Furthermore, it is not anticipated that the patient will be medically stable for discharge from the hospital within 2 midnights of admission.   * I certify that at the point of admission it is my clinical judgment that the patient will require inpatient hospital care spanning beyond 2 midnights from the point of admission due to high intensity of service, high risk for further deterioration and high frequency of surveillance required.*       Date of Service 12/11/2023    Lorretta Harp [facility]   If 7PM-7AM, please contact night-coverage [email or web address] 12/11/2023, 1:09 AM

## 2023-12-11 ENCOUNTER — Inpatient Hospital Stay: Payer: Medicare Other

## 2023-12-11 ENCOUNTER — Inpatient Hospital Stay: Payer: Medicare Other | Admitting: Certified Registered"

## 2023-12-11 ENCOUNTER — Other Ambulatory Visit: Payer: Self-pay

## 2023-12-11 ENCOUNTER — Encounter: Payer: Self-pay | Admitting: Internal Medicine

## 2023-12-11 ENCOUNTER — Encounter
Admission: EM | Disposition: A | Discharge status: Skilled Nursing Facility | Payer: Self-pay | Source: Home / Self Care | Attending: Internal Medicine

## 2023-12-11 DIAGNOSIS — W19XXXA Unspecified fall, initial encounter: Secondary | ICD-10-CM | POA: Insufficient documentation

## 2023-12-11 DIAGNOSIS — E871 Hypo-osmolality and hyponatremia: Secondary | ICD-10-CM | POA: Diagnosis present

## 2023-12-11 DIAGNOSIS — Y92009 Unspecified place in unspecified non-institutional (private) residence as the place of occurrence of the external cause: Secondary | ICD-10-CM

## 2023-12-11 DIAGNOSIS — S72302A Unspecified fracture of shaft of left femur, initial encounter for closed fracture: Secondary | ICD-10-CM | POA: Diagnosis not present

## 2023-12-11 HISTORY — PX: INTRAMEDULLARY (IM) NAIL INTERTROCHANTERIC: SHX5875

## 2023-12-11 LAB — CBC
HCT: 31.3 % — ABNORMAL LOW (ref 36.0–46.0)
Hemoglobin: 10.8 g/dL — ABNORMAL LOW (ref 12.0–15.0)
MCH: 33.4 pg (ref 26.0–34.0)
MCHC: 34.5 g/dL (ref 30.0–36.0)
MCV: 96.9 fL (ref 80.0–100.0)
Platelets: 213 10*3/uL (ref 150–400)
RBC: 3.23 MIL/uL — ABNORMAL LOW (ref 3.87–5.11)
RDW: 13.6 % (ref 11.5–15.5)
WBC: 5.2 10*3/uL (ref 4.0–10.5)
nRBC: 0 % (ref 0.0–0.2)

## 2023-12-11 LAB — BASIC METABOLIC PANEL
Anion gap: 9 (ref 5–15)
BUN: 11 mg/dL (ref 8–23)
CO2: 23 mmol/L (ref 22–32)
Calcium: 8.6 mg/dL — ABNORMAL LOW (ref 8.9–10.3)
Chloride: 101 mmol/L (ref 98–111)
Creatinine, Ser: 0.82 mg/dL (ref 0.44–1.00)
GFR, Estimated: >60 mL/min (ref >60)
Glucose, Bld: 106 mg/dL — ABNORMAL HIGH (ref 70–99)
Potassium: 4.2 mmol/L (ref 3.5–5.1)
Sodium: 133 mmol/L — ABNORMAL LOW (ref 135–145)

## 2023-12-11 LAB — TYPE AND SCREEN
ABO/RH(D): A POS
Antibody Screen: NEGATIVE

## 2023-12-11 LAB — PROTIME-INR
INR: 1.7 — ABNORMAL HIGH (ref 0.8–1.2)
Prothrombin Time: 20.6 s — ABNORMAL HIGH (ref 11.4–15.2)

## 2023-12-11 LAB — ABO/RH: ABO/RH(D): A POS

## 2023-12-11 SURGERY — FIXATION, FRACTURE, INTERTROCHANTERIC, WITH INTRAMEDULLARY ROD
Anesthesia: General | Laterality: Left

## 2023-12-11 MED ORDER — 0.9 % SODIUM CHLORIDE (POUR BTL) OPTIME
TOPICAL | Status: DC | PRN
  Administered 2023-12-11: 500 mL

## 2023-12-11 MED ORDER — ACETAMINOPHEN 500 MG PO TABS
500.0000 mg | ORAL_TABLET | Freq: Four times a day (QID) | ORAL | Status: AC
  Administered 2023-12-11 – 2023-12-12 (×4): 500 mg via ORAL
  Filled 2023-12-11 (×4): qty 1

## 2023-12-11 MED ORDER — VITAMIN K1 10 MG/ML IJ SOLN
2.0000 mg | Freq: Once | INTRAVENOUS | Status: AC
  Administered 2023-12-11: 2 mg via INTRAVENOUS
  Filled 2023-12-11: qty 0.2

## 2023-12-11 MED ORDER — FENTANYL CITRATE (PF) 100 MCG/2ML IJ SOLN
INTRAMUSCULAR | Status: AC
  Filled 2023-12-11: qty 2

## 2023-12-11 MED ORDER — LIDOCAINE HCL (CARDIAC) PF 100 MG/5ML IV SOSY
PREFILLED_SYRINGE | INTRAVENOUS | Status: DC | PRN
  Administered 2023-12-11: 60 mg via INTRAVENOUS

## 2023-12-11 MED ORDER — SENNOSIDES-DOCUSATE SODIUM 8.6-50 MG PO TABS
1.0000 | ORAL_TABLET | Freq: Every day | ORAL | Status: DC
  Administered 2023-12-11 – 2023-12-16 (×5): 1 via ORAL
  Filled 2023-12-11 (×6): qty 1

## 2023-12-11 MED ORDER — LACTATED RINGERS IV SOLN: INTRAVENOUS | Status: DC | PRN

## 2023-12-11 MED ORDER — PROPOFOL 10 MG/ML IV BOLUS
INTRAVENOUS | Status: DC | PRN
  Administered 2023-12-11: 100 mg via INTRAVENOUS

## 2023-12-11 MED ORDER — POLYETHYLENE GLYCOL 3350 17 G PO PACK
17.0000 g | PACK | Freq: Every day | ORAL | Status: DC
  Administered 2023-12-12: 17 g via ORAL
  Filled 2023-12-11: qty 1

## 2023-12-11 MED ORDER — BUPIVACAINE-EPINEPHRINE (PF) 0.25% -1:200000 IJ SOLN
INTRAMUSCULAR | Status: DC | PRN
  Administered 2023-12-11: 20 mL

## 2023-12-11 MED ORDER — GABAPENTIN 100 MG PO CAPS
100.0000 mg | ORAL_CAPSULE | Freq: Three times a day (TID) | ORAL | Status: DC
  Administered 2023-12-11 – 2023-12-18 (×20): 100 mg via ORAL
  Filled 2023-12-11 (×20): qty 1

## 2023-12-11 MED ORDER — SODIUM CHLORIDE 0.9 % IV BOLUS
1000.0000 mL | Freq: Once | INTRAVENOUS | Status: AC
  Administered 2023-12-11: 1000 mL via INTRAVENOUS

## 2023-12-11 MED ORDER — FENTANYL CITRATE (PF) 100 MCG/2ML IJ SOLN
INTRAMUSCULAR | Status: DC | PRN
  Administered 2023-12-11 (×2): 50 ug via INTRAVENOUS

## 2023-12-11 MED ORDER — DEXAMETHASONE SODIUM PHOSPHATE 10 MG/ML IJ SOLN
INTRAMUSCULAR | Status: AC
  Filled 2023-12-11: qty 1

## 2023-12-11 MED ORDER — MENTHOL 3 MG MT LOZG
1.0000 | LOZENGE | OROMUCOSAL | Status: DC | PRN
  Administered 2023-12-12: 3 mg via ORAL
  Filled 2023-12-11: qty 9

## 2023-12-11 MED ORDER — HYDROMORPHONE HCL 1 MG/ML IJ SOLN
INTRAMUSCULAR | Status: AC
  Filled 2023-12-11: qty 1

## 2023-12-11 MED ORDER — OXYCODONE HCL 5 MG/5ML PO SOLN: 5.0000 mg | Freq: Once | ORAL | Status: DC | PRN

## 2023-12-11 MED ORDER — ONDANSETRON HCL 4 MG/2ML IJ SOLN
INTRAMUSCULAR | Status: AC
  Filled 2023-12-11: qty 2

## 2023-12-11 MED ORDER — BUPIVACAINE-EPINEPHRINE (PF) 0.25% -1:200000 IJ SOLN
INTRAMUSCULAR | Status: AC
  Filled 2023-12-11: qty 30

## 2023-12-11 MED ORDER — HYDROMORPHONE HCL 1 MG/ML IJ SOLN
INTRAMUSCULAR | Status: DC | PRN
  Administered 2023-12-11 (×3): .2 mg via INTRAVENOUS

## 2023-12-11 MED ORDER — WARFARIN SODIUM 3 MG PO TABS
3.0000 mg | ORAL_TABLET | Freq: Once | ORAL | Status: AC
  Administered 2023-12-11: 3 mg via ORAL
  Filled 2023-12-11: qty 1

## 2023-12-11 MED ORDER — ACETAMINOPHEN 10 MG/ML IV SOLN: 1000.0000 mg | Freq: Once | INTRAVENOUS | Status: DC | PRN

## 2023-12-11 MED ORDER — METOCLOPRAMIDE HCL 5 MG/ML IJ SOLN: 5.0000 mg | Freq: Three times a day (TID) | INTRAMUSCULAR | Status: DC | PRN

## 2023-12-11 MED ORDER — ROCURONIUM BROMIDE 100 MG/10ML IV SOLN
INTRAVENOUS | Status: DC | PRN
  Administered 2023-12-11: 50 mg via INTRAVENOUS

## 2023-12-11 MED ORDER — ADULT MULTIVITAMIN W/MINERALS CH
1.0000 | ORAL_TABLET | Freq: Every day | ORAL | Status: DC
  Administered 2023-12-12 – 2023-12-18 (×7): 1 via ORAL
  Filled 2023-12-11 (×7): qty 1

## 2023-12-11 MED ORDER — LIDOCAINE HCL (PF) 2 % IJ SOLN
INTRAMUSCULAR | Status: AC
  Filled 2023-12-11: qty 5

## 2023-12-11 MED ORDER — TRANEXAMIC ACID-NACL 1000-0.7 MG/100ML-% IV SOLN
INTRAVENOUS | Status: DC | PRN
  Administered 2023-12-11: 1000 mg via INTRAVENOUS

## 2023-12-11 MED ORDER — METOCLOPRAMIDE HCL 5 MG PO TABS
5.0000 mg | ORAL_TABLET | Freq: Three times a day (TID) | ORAL | Status: DC | PRN
Start: 2023-12-11 — End: 2023-12-14

## 2023-12-11 MED ORDER — OXYCODONE HCL 5 MG PO TABS: 5.0000 mg | ORAL_TABLET | Freq: Once | ORAL | Status: DC | PRN

## 2023-12-11 MED ORDER — PHENOL 1.4 % MT LIQD: 1.0000 | OROMUCOSAL | Status: DC | PRN

## 2023-12-11 MED ORDER — SUGAMMADEX SODIUM 200 MG/2ML IV SOLN
INTRAVENOUS | Status: DC | PRN
  Administered 2023-12-11: 200 mg via INTRAVENOUS

## 2023-12-11 MED ORDER — TRANEXAMIC ACID-NACL 1000-0.7 MG/100ML-% IV SOLN
INTRAVENOUS | Status: AC
  Filled 2023-12-11: qty 100

## 2023-12-11 MED ORDER — CEFAZOLIN SODIUM-DEXTROSE 2-3 GM-%(50ML) IV SOLR
INTRAVENOUS | Status: DC | PRN
  Administered 2023-12-11: 2 g via INTRAVENOUS

## 2023-12-11 MED ORDER — CEFAZOLIN SODIUM-DEXTROSE 2-4 GM/100ML-% IV SOLN
2.0000 g | Freq: Four times a day (QID) | INTRAVENOUS | Status: AC
  Administered 2023-12-11 – 2023-12-12 (×2): 2 g via INTRAVENOUS
  Filled 2023-12-11 (×2): qty 100

## 2023-12-11 MED ORDER — PHENYLEPHRINE 80 MCG/ML (10ML) SYRINGE FOR IV PUSH (FOR BLOOD PRESSURE SUPPORT)
PREFILLED_SYRINGE | INTRAVENOUS | Status: DC | PRN
  Administered 2023-12-11 (×2): 80 ug via INTRAVENOUS

## 2023-12-11 MED ORDER — WARFARIN - PHARMACIST DOSING INPATIENT
Freq: Every day | Status: DC
  Administered 2023-12-16: 3.5

## 2023-12-11 MED ORDER — DOCUSATE SODIUM 100 MG PO CAPS
100.0000 mg | ORAL_CAPSULE | Freq: Two times a day (BID) | ORAL | Status: DC
  Administered 2023-12-11 – 2023-12-18 (×11): 100 mg via ORAL
  Filled 2023-12-11 (×13): qty 1

## 2023-12-11 MED ORDER — PROPOFOL 10 MG/ML IV BOLUS
INTRAVENOUS | Status: AC
  Filled 2023-12-11: qty 20

## 2023-12-11 MED ORDER — CEFAZOLIN SODIUM-DEXTROSE 2-4 GM/100ML-% IV SOLN
INTRAVENOUS | Status: AC
  Filled 2023-12-11: qty 100

## 2023-12-11 MED ORDER — DROPERIDOL 2.5 MG/ML IJ SOLN: 0.6250 mg | Freq: Once | INTRAMUSCULAR | Status: DC | PRN

## 2023-12-11 MED ORDER — ONDANSETRON HCL 4 MG/2ML IJ SOLN
INTRAMUSCULAR | Status: DC | PRN
  Administered 2023-12-11: 4 mg via INTRAVENOUS

## 2023-12-11 MED ORDER — EPHEDRINE SULFATE-NACL 50-0.9 MG/10ML-% IV SOSY
PREFILLED_SYRINGE | INTRAVENOUS | Status: DC | PRN
  Administered 2023-12-11 (×2): 5 mg via INTRAVENOUS
  Administered 2023-12-11: 10 mg via INTRAVENOUS
  Administered 2023-12-11: 5 mg via INTRAVENOUS

## 2023-12-11 MED ORDER — FENTANYL CITRATE (PF) 100 MCG/2ML IJ SOLN
25.0000 ug | INTRAMUSCULAR | Status: DC | PRN
  Administered 2023-12-11 (×4): 25 ug via INTRAVENOUS

## 2023-12-11 MED ORDER — ROCURONIUM BROMIDE 10 MG/ML (PF) SYRINGE
PREFILLED_SYRINGE | INTRAVENOUS | Status: AC
  Filled 2023-12-11: qty 10

## 2023-12-11 SURGICAL SUPPLY — 38 items
BIT DRILL CANN 16 HIP (BIT) IMPLANT
BIT DRILL CANN STP 6/9 HIP (BIT) IMPLANT
BIT DRILL SHORT 4.2 (BIT) IMPLANT
BIT DRILL TAPERED 10 (BIT) IMPLANT
BLADE TFNA HELICAL 85 (Anchor) IMPLANT
BNDG COHESIVE 6X5 TAN ST LF (GAUZE/BANDAGES/DRESSINGS) ×2 IMPLANT
CHLORAPREP W/TINT 26 (MISCELLANEOUS) ×1 IMPLANT
DERMABOND ADVANCED .7 DNX12 (GAUZE/BANDAGES/DRESSINGS) ×1 IMPLANT
DRAPE C-ARM XRAY 36X54 (DRAPES) ×1 IMPLANT
DRAPE SHEET LG 3/4 BI-LAMINATE (DRAPES) ×1 IMPLANT
DRSG OPSITE POSTOP 3X4 (GAUZE/BANDAGES/DRESSINGS) IMPLANT
ELECT REM PT RETURN 9FT ADLT (ELECTROSURGICAL) ×1
ELECTRODE REM PT RTRN 9FT ADLT (ELECTROSURGICAL) IMPLANT
GLOVE PI ORTHO PRO STRL 7.5 (GLOVE) ×2 IMPLANT
GLOVE SURG SYN 7.5 E (GLOVE) ×1 IMPLANT
GLOVE SURG SYN 7.5 PF PI (GLOVE) ×1 IMPLANT
GOWN SRG XL LVL 3 NONREINFORCE (GOWNS) ×1 IMPLANT
GOWN STRL REUS W/ TWL LRG LVL3 (GOWN DISPOSABLE) ×1 IMPLANT
GUIDEWIRE 3.2X400 (WIRE) IMPLANT
HANDLE YANKAUER SUCT OPEN TIP (MISCELLANEOUS) ×1 IMPLANT
KIT PATIENT CARE HANA TABLE (KITS) ×1 IMPLANT
KIT TURNOVER CYSTO (KITS) ×1 IMPLANT
MANIFOLD NEPTUNE II (INSTRUMENTS) ×1 IMPLANT
MAT ABSORB FLUID 56X50 GRAY (MISCELLANEOUS) ×1 IMPLANT
NAIL CANN TFNA 9 130D 380 (Nail) IMPLANT
NDL HYPO 21X1.5 SAFETY (NEEDLE) ×1 IMPLANT
NEEDLE HYPO 21X1.5 SAFETY (NEEDLE) ×1 IMPLANT
NS IRRIG 500ML POUR BTL (IV SOLUTION) ×1 IMPLANT
PACK HIP COMPR (MISCELLANEOUS) ×1 IMPLANT
REAMER ROD DEEP FLUTE 2.5X950 (INSTRUMENTS) IMPLANT
SCREW LOCK STAR 5X38 (Screw) IMPLANT
SCREW LOCK STAR 5X42 (Screw) IMPLANT
SUT STRATA 1 CT-1 DLB (SUTURE) ×1
SUT VIC AB 1 CT1 36 (SUTURE) ×1 IMPLANT
SUT VIC AB 2-0 CT2 27 (SUTURE) ×1 IMPLANT
SUTURE STRATA SPIR 4-0 18 (SUTURE) ×1 IMPLANT
SYR 30ML LL (SYRINGE) ×1 IMPLANT
WATER STERILE IRR 1000ML POUR (IV SOLUTION) ×1 IMPLANT

## 2023-12-11 NOTE — Anesthesia Preprocedure Evaluation (Addendum)
Anesthesia Evaluation  Patient identified by MRN, date of birth, ID band Patient awake    Reviewed: Allergy & Precautions, H&P , NPO status , Patient's Chart, lab work & pertinent test results, reviewed documented beta blocker date and time   Airway Mallampati: II  TM Distance: >3 FB Neck ROM: full    Dental  (+) Teeth Intact   Pulmonary neg pulmonary ROS   Pulmonary exam normal        Cardiovascular Exercise Tolerance: Poor hypertension, On Medications + CAD, + Peripheral Vascular Disease and +CHF  negative cardio ROS Normal cardiovascular exam Rhythm:regular Rate:Normal     Neuro/Psych negative neurological ROS  negative psych ROS   GI/Hepatic ,GERD  Medicated,,(+) Hepatitis -  Endo/Other  negative endocrine ROS    Renal/GU Renal disease  negative genitourinary   Musculoskeletal   Abdominal   Peds  Hematology negative hematology ROS (+)   Anesthesia Other Findings Past Medical History: No date: Arthritis No date: Atrophic vaginitis No date: Chronic kidney disease No date: Coronary artery disease No date: GERD (gastroesophageal reflux disease) No date: Hematuria     Comment:  microscopic No date: Hepatitis No date: Hyperlipidemia No date: Hypertension No date: Kidney stone No date: Peripheral vascular disease (HCC) No date: Portal vein thrombosis Past Surgical History: 04/27/2020: BREAST BIOPSY; Right     Comment:  [place] Bx, Coil Clip, BENIGN MAMMARY PARENCHYMA WITH               FIBROCYSTIC AND FOCAL APOCRINE  No date: CERVICAL SPINE SURGERY     Comment:  ruptured disc   patient states never any surgery for               this 10/12/2015: COLONOSCOPY WITH PROPOFOL; N/A     Comment:  Procedure: COLONOSCOPY WITH PROPOFOL;  Surgeon: Scot Jun, MD;  Location: [facility] ENDOSCOPY;  Service:               Endoscopy;  Laterality: N/A; 10/01/2023: COLONOSCOPY WITH PROPOFOL; N/A     Comment:   Procedure: COLONOSCOPY WITH PROPOFOL;  Surgeon: Toledo,               Boykin Nearing, MD;  Location: [facility] ENDOSCOPY;  Service:               Gastroenterology;  Laterality: N/A; No date: HEEL SPUR SURGERY No date: LIP REPAIR; N/A No date: TONSILLECTOMY BMI    Body Mass Index: 29.28 kg/m     Reproductive/Obstetrics negative OB ROS                             Anesthesia Physical Anesthesia Plan  ASA: 3 and emergent  Anesthesia Plan: General ETT   Post-op Pain Management:    Induction:   PONV Risk Score and Plan: 4 or greater  Airway Management Planned:   Additional Equipment:   Intra-op Plan:   Post-operative Plan:   Informed Consent: I have reviewed the patients History and Physical, chart, labs and discussed the procedure including the risks, benefits and alternatives for the proposed anesthesia with the patient or authorized representative who has indicated his/her understanding and acceptance.     Dental Advisory Given  Plan Discussed with: CRNA  Anesthesia Plan Comments: (Surgeon and family desire to proceed sp Vit K and current INR. Will proceed with GOT and pt understands risks of potential for  xfusion. ja)       Anesthesia Quick Evaluation

## 2023-12-11 NOTE — Progress Notes (Signed)
PROGRESS NOTE    Mary Buckley  ZOX:[id number] DOB: 09-Apr-1941 DOA: 12/10/2023 PCP: Marguarite Arbour, MD     Brief Narrative:   From admission h and p  Mary Buckley is a 83 y.o. 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   Patient states that she accidentally fell when she was walking at home.  Denies loss of consciousness.  No head or neck injury.  She developed pain in left upper leg and hip area, which is constant, sharp, severe, nonradiating, aggravated by movement.  No chest pain, cough, SOB.  No nausea, vomiting, diarrhea or abdominal pain.  No symptoms of UTI.  Assessment & Plan:   Principal Problem:   Femur fracture, left (HCC) Active Problems:   Fall at home, initial encounter   Deep vein thrombosis of portal vein   Benign essential HTN   HLD (hyperlipidemia)   Chronic diastolic CHF (congestive heart failure) (HCC)   Chronic kidney disease, stage 3a (HCC)   Overweight (BMI 25.0-29.9)   Hyponatremia  # Acute left femur fracture After fall - operative repair today per ortho - pain control, bowel regimen, pt/ot  # Portal vein thrombosis On coumadin at home, reversed yesterday, inr 1.7 - holding coumadin, re-start per ortho  # Hypoxia Overnight, with morphine. No respiratory symptoms - check cxr - wean o2 after surgery  # Osteoporosis On prolia outpt, follows w/ endo - will need endo f/u  # HTN Bps appropriate - home indur, diastolic  # Hyponatremia Improved to 133 today with gentle fluids - monitor  # Chronic pain - home gabapentin   DVT prophylaxis: SCDs Code Status: full Family Communication: children updated @ bedside  Level of care: Telemetry Medical Status is: Inpatient Remains inpatient appropriate because: severity of illness    Consultants:   orthopedics  Procedures: pending  Antimicrobials:  Pre-operative    Subjective: Ongoing left leg pain mainly in thigh  Objective: Vitals:   12/10/23 2216 12/10/23 2300 12/11/23 0101 12/11/23 0818  BP:  (!) 148/96 131/85 119/65  Pulse:  70 66 83  Resp:  15  16  Temp:  98.3 F (36.8 C) 97.9 F (36.6 C) 98.2 F (36.8 C)  TempSrc:  Oral Oral Oral  SpO2: 97% 100% 99% 97%  Weight:      Height:        Intake/Output Summary (Last 24 hours) at 12/11/2023 1331 Last data filed at 12/11/2023 1204 Gross per 24 hour  Intake --  Output 1200 ml  Net -1200 ml   Filed Weights   12/10/23 1931  Weight: 68 kg    Examination:  General exam: Appears calm and comfortable  Respiratory system: Clear to auscultation. Respiratory effort normal. Cardiovascular system: S1 & S2 heard, RRR.  Gastrointestinal system: Abdomen is nondistended, soft and nontender  Central nervous system: Alert and oriented. No focal neurological deficits. Extremities: left leg external rotation, distal sensation intact, normal pulses Skin: No rashes, lesions or ulcers Psychiatry: Judgement and insight appear normal. Mood & affect appropriate.     Data Reviewed: I have personally reviewed following labs and imaging studies  CBC: Recent Labs  Lab 12/10/23 2123 12/11/23 1006  WBC 7.6 5.2  NEUTROABS 6.5  --   HGB 10.9* 10.8*  HCT 30.9* 31.3*  MCV 95.7 96.9  PLT 205 213   Basic Metabolic Panel: Recent Labs  Lab 12/10/23 2123 12/11/23 1006  NA 128* 133*  K 4.3 4.2  CL 88* 101  CO2 23 23  GLUCOSE 127* 106*  BUN 16 11  CREATININE 1.05* 0.82  CALCIUM 9.4 8.6*   GFR: Estimated Creatinine Clearance: 45.5 mL/min (by C-G formula based on SCr of 0.82 mg/dL). Liver Function Tests: No results for input(s): "AST", "ALT", "ALKPHOS", "BILITOT", "PROT", "ALBUMIN" in the last 168 hours. No results for input(s): "LIPASE", "AMYLASE" in the last 168 hours. No results for input(s): "AMMONIA" in the last 168  hours. Coagulation Profile: Recent Labs  Lab 12/10/23 2123 12/11/23 0408  INR 1.9* 1.7*   Cardiac Enzymes: No results for input(s): "CKTOTAL", "CKMB", "CKMBINDEX", "TROPONINI" in the last 168 hours. BNP (last 3 results) No results for input(s): "PROBNP" in the last 8760 hours. HbA1C: No results for input(s): "HGBA1C" in the last 72 hours. CBG: No results for input(s): "GLUCAP" in the last 168 hours. Lipid Profile: No results for input(s): "CHOL", "HDL", "LDLCALC", "TRIG", "CHOLHDL", "LDLDIRECT" in the last 72 hours. Thyroid Function Tests: No results for input(s): "TSH", "T4TOTAL", "FREET4", "T3FREE", "THYROIDAB" in the last 72 hours. Anemia Panel: No results for input(s): "VITAMINB12", "FOLATE", "FERRITIN", "TIBC", "IRON", "RETICCTPCT" in the last 72 hours. Urine analysis:    Component Value Date/Time   COLORURINE STRAW (A) 03/02/2023 0726   APPEARANCEUR CLEAR (A) 03/02/2023 0726   APPEARANCEUR Clear 01/01/2023 1333   LABSPEC 1.015 03/02/2023 0726   LABSPEC 1.003 10/01/2014 0607   PHURINE 6.0 03/02/2023 0726   GLUCOSEU NEGATIVE 03/02/2023 0726   GLUCOSEU Negative 10/01/2014 0607   HGBUR MODERATE (A) 03/02/2023 0726   BILIRUBINUR NEGATIVE 03/02/2023 0726   BILIRUBINUR Negative 01/01/2023 1333   BILIRUBINUR Negative 10/01/2014 0607   KETONESUR NEGATIVE 03/02/2023 0726   PROTEINUR NEGATIVE 03/02/2023 0726   NITRITE NEGATIVE 03/02/2023 0726   LEUKOCYTESUR NEGATIVE 03/02/2023 0726   LEUKOCYTESUR Trace 10/01/2014 0607   Sepsis Labs: @LABRCNTIP (procalcitonin:4,lacticidven:4)  )No results found for this or any previous visit (from the past 240 hours).       Radiology Studies: CT HEAD WO CONTRAST ( ) Result Date: 12/11/2023 CLINICAL DATA:  Head trauma, minor (Age >= 65y) fall (lowered to ground) resulting in L leg shortening with lateral rotation - pulses and cap refill intact at this time. When being lowered to the ground pt heard pop. Did not hit head. Takes  coumadin. EXAM: CT HEAD WITHOUT CONTRAST TECHNIQUE: Contiguous axial images were obtained from the base of the skull through the vertex without intravenous contrast. RADIATION DOSE REDUCTION: This exam was performed according to the departmental dose-optimization program which includes automated exposure control, adjustment of the mA and/or kV according to patient size and/or use of iterative reconstruction technique. COMPARISON:  CT head 03/02/2023. FINDINGS: Brain: No evidence of large-territorial acute infarction. No parenchymal hemorrhage. No mass lesion. No extra-axial collection. No mass effect or midline shift. No hydrocephalus. Basilar cisterns are patent. Vascular: No hyperdense vessel. Atherosclerotic calcifications are present within the cavernous internal carotid and vertebral arteries. Skull: No acute fracture or focal lesion. Sinuses/Orbits: Right sphenoid sinus mucosal thickening and chronic retention cyst. Otherwise paranasal sinuses and mastoid air cells are clear. The orbits are unremarkable. Other: None. IMPRESSION: No acute intracranial abnormality. Electronically Signed   By: Tish Frederickson M.D.   On: 12/11/2023 00:39   CT FEMUR LEFT WO CONTRAST Result Date: 12/10/2023 CLINICAL DATA:  Left femur fracture EXAM: CT OF THE LOWER LEFT EXTREMITY WITHOUT CONTRAST TECHNIQUE: Multidetector CT imaging of the lower left extremity was  performed according to the standard protocol. RADIATION DOSE REDUCTION: This exam was performed according to the departmental dose-optimization program which includes automated exposure control, adjustment of the mA and/or kV according to patient size and/or use of iterative reconstruction technique. COMPARISON:  Left femur x-ray 12/10/2023 FINDINGS: Bones/Joint/Cartilage There is an acute transverse fracture through the proximal 1/3 of the femoral diaphysis. The distal fracture fragment is rotated externally in displaced posterior medially to the proximal fracture  fragment 1.5 shaft width. There is 5 cm of overlap. There is no dislocation. No significant knee or hip joint effusion identified. Ligaments Suboptimally assessed by CT. Muscles and Tendons There is intramuscular edema surrounding the fracture site. No focal hematoma identified. Soft tissues Peripheral vascular calcifications are present. No focal hematoma or foreign body. Varicose veins are seen in the proximal calf. Sigmoid colon diverticula are present. IMPRESSION: Acute displaced and rotated fracture of the proximal femoral diaphysis. Electronically Signed   By: Darliss Cheney M.D.   On: 12/10/2023 21:11   DG Femur Min 2 Views Left Result Date: 12/10/2023 CLINICAL DATA:  Recent fall with left thigh deformity and foreshortening, initial encounter EXAM: LEFT FEMUR 2 VIEWS COMPARISON:  None Available. FINDINGS: Mid to distal diaphyseal fracture is noted in the left femur. Bony overlap is noted as previously described. No other focal abnormality is seen. IMPRESSION: Left femoral fracture with bony overlap. Electronically Signed   By: Alcide Clever M.D.   On: 12/10/2023 20:15   DG Hip Unilat W or Wo Pelvis 2-3 Views Left Result Date: 12/10/2023 CLINICAL DATA:  Recent fall with left hip pain, initial encounter EXAM: DG HIP (WITH OR WITHOUT PELVIS) 3V LEFT COMPARISON:  None Available. FINDINGS: Pelvic ring is intact. Left femoral head is well seated. There is a mildly oblique fracture through the proximal to mid femoral shaft with proximally 4 cm of bony overlap at the fracture site. IMPRESSION: Left femoral diaphyseal fracture with significant bony overlap at the fracture site. Electronically Signed   By: Alcide Clever M.D.   On: 12/10/2023 20:14        Scheduled Meds:  isosorbide mononitrate  15 mg Oral Daily   lidocaine  1 patch Transdermal Q24H   [START ON 12/12/2023] multivitamin with minerals  1 tablet Oral Daily   nebivolol  10 mg Oral Daily   pantoprazole  40 mg Oral Daily   polyvinyl alcohol  1  drop Both Eyes Daily   rosuvastatin  5 mg Oral BID   sodium chloride  1 g Oral BID WC   Continuous Infusions:   LOS: 1 day     Silvano Bilis, MD [facility]   If 7PM-7AM, please contact night-coverage [email or web address] Password [email or web address] 12/11/2023, 1:31 PM

## 2023-12-11 NOTE — Op Note (Signed)
Patient Name: Mekesha Solomon  WUJ:[id number]  Pre-Operative Diagnosis: Left femoral shaft fracture  Post-Operative Diagnosis: (same)  Procedure: Left femoral shaft fracture  Components/Implants: Nail:TFNA 58mm/130 deg x  Lag Blade :85mm Locking Screws:38 and 42 x5.78mm   Date of Surgery: 12/11/2023  Surgeon: Reinaldo Berber MD  Assistant: None  Anesthesiologist: Suzan Slick  Anesthesia: General   EBL: 50cc  IVF: 600cc  Complications: None   Brief history: The patient is a 83 year old female who presented to the [facility] emergency room after a fall and found to have a left femoral shaft fracture.  The patient was admitted by the medical team and optimized for surgery.  A thorough discussion was had with the patient and family about the risks and benefits of surgical intervention for their hip fracture as definitive treatment.  The patient and family opted to proceed with the operation.  All preoperative films were reviewed and an appropriate surgical plan was made prior to surgery.   Description of procedure: The patient was brought to the operating room where laterality was confirmed by all those present to be the left side.  The patient was administered anesthesia on a stretcher prior to being moved supine on the operating room table. Patient was given an intravenous dose of antibiotics for surgical prophylaxis and TXA.  All bony prominences and extremities were well padded and the patient was securely attached to the table boots, a perineal post was placed and the patient had a safety strap placed.  Surgical site was prepped with alcohol and chlorhexidine. The surgical site over the hip was and draped in typical sterile fashion with multiple layers of adhesive and nonadhesive drapes.  The incision site was marked out with a sterile marker under fluoroscopic guidance.    A surgical timeout was then called with participation of all staff in the room the patient was  then a confirmed again and laterality confirmed. The hip fracture was reduced through indirect measures with traction and rotation of the leg on the fracture table. After an acceptable reduction was obtained on AP and lateral images the procedure started.  An incision was made just proximal to the greater trochanter through the skin subcutaneous tissues and an incision was made in the glut max fascia.  A guidewire was placed through the greater trochanter at the tip under x-ray guidance into the intertrochanteric region.  The position of this wire was assessed on AP and lateral fluoroscopic images to ensure position.  An opening reamer was used to create access at the tip of the greater trochanter under fluoroscopic guidance.   A finger tool was then advanced down the femoral canal to the fracture site and utilized to align the fracture site.  A long ball-tipped guidewire was then placed through the finger tool down distally to the superior pole of the patella.  The length of the wire was measured using the measuring tool and a 380 mm nail was selected.  The canal was then sequentially reamed up to 10-1/2 mm with a flexible reamer under fluoroscopic guidance.  A size 61mmx380mm nail was advanced through the reamed hole in the proximal femur and seated within the femur to an appropriate depth under fluoroscopic guidance.  The aiming jig was used to mark an incision site for a lag blade to the femoral head which was then incised with a scalpel.  A wire was then advanced through the lateral cortex of the femur into the center of the femoral head on both AP  and lateral fluoroscopic imaging stopping at the subchondral bone without penetration of the femoral head.  This wire was then used to measure for a lag blade and a size 85mm lag blade was inserted into the femoral head through the lateral cortex of the femur and the nail under fluoroscopic guidance.  The setscrew was then tightened in the proximal part of the  nail and a fixed angle construct.  The lag blade impactor was removed.  The rotational alignment of the fracture was then assessed using cortical thickness to align rotation.  Once appropriate rotation was achieved the traction was released on the leg and the fracture keyed in and compressed in appropriate alignment.  Attention was then turned to the distal aspect of the nail and using perfect circle technique 1 screw was placed in static locking hole and a second screw was placed in a dynamic locking hole.  AP and lateral images were taken to confirm that the screws were within the bone and appropriate length and within the nail.  The intramedullary nail was assessed on AP and lateral fluoroscopic guidance prior to removal of the aiming jig.  Final fluoroscopic x-rays were then taken after removal of the jig.  The nail was found to be in appropriate position on AP and lateral imaging with appropriate lengths of both the lag screw in the distal locking screws.  The fascia was closed with #1 Vicryl interrupted figure-of-eight sutures.  The subcutaneous tissues were closed with 2-0 Vicryl and the skin closed with 4-0 to fix and Dermabond.  Sterile dressings were applied to the incisions.   The patient was awoken from anesthesia transferred off of the operating room table onto a hospital bed.  The patient had a good pulse postoperatively in the foot . the patient was then transferred to the PACU in stable condition.

## 2023-12-11 NOTE — Anesthesia Postprocedure Evaluation (Signed)
Anesthesia Post Note  Patient: Mary Buckley  Procedure(s) Performed: INTRAMEDULLARY (IM) NAIL INTERTROCHANTERIC (Left)  Patient location during evaluation: PACU Anesthesia Type: General Level of consciousness: awake and alert Pain management: pain level controlled Vital Signs Assessment: post-procedure vital signs reviewed and stable Respiratory status: spontaneous breathing, nonlabored ventilation, respiratory function stable and patient connected to nasal cannula oxygen Cardiovascular status: blood pressure returned to baseline and stable Postop Assessment: no apparent nausea or vomiting Anesthetic complications: no   No notable events documented.   Last Vitals:  Vitals:   12/11/23 1840 12/11/23 1947  BP: 108/69 118/66  Pulse: 66 70  Resp: 17 20  Temp: 36.4 C 36.6 C  SpO2: 96% 97%    Last Pain:  Vitals:   12/11/23 1800  TempSrc:   PainSc: 4                  Corinda Gubler

## 2023-12-11 NOTE — Transfer of Care (Signed)
Immediate Anesthesia Transfer of Care Note  Patient: Mary Buckley  Procedure(s) Performed: INTRAMEDULLARY (IM) NAIL INTERTROCHANTERIC (Left)  Patient Location: PACU  Anesthesia Type:General  Level of Consciousness: awake  Airway & Oxygen Therapy: Patient Spontanous Breathing and Patient connected to nasal cannula oxygen  Post-op Assessment: Report given to RN and Post -op Vital signs reviewed and stable  Post vital signs: Reviewed and stable  Last Vitals:  Vitals Value Taken Time  BP 133/73 12/11/23 1719  Temp 36.1 C 12/11/23 1719  Pulse 63 12/11/23 1723  Resp 9 12/11/23 1723  SpO2 100 % 12/11/23 1723  Vitals shown include unfiled device data.  Last Pain:  Vitals:   12/11/23 1359  TempSrc: Temporal  PainSc: 6       Patients Stated Pain Goal: 2 (12/11/23 1223)  Complications: No notable events documented.

## 2023-12-11 NOTE — Consult Note (Signed)
ORTHOPAEDIC CONSULTATION  REQUESTING PHYSICIAN: Wouk, Wilfred Curtis, MD  Chief Complaint:   Left femoral shaft fracture  History of Present Illness: Mary Buckley is a 83 y.o. 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  Patient is unsure if she twisted and fell or lost her balance or if she felt the femur "snap "prior to her hitting the ground.  Patient reports she had had some radiating pain down her left leg and her right leg from her back in the past and had had some left leg pain over the past few months prior to the fall.  Of note the patient is treated on injectable bisphosphonate treatments and has been treated for the last year and a half.  She was scheduled for repeat follow-up for the bisphosphonate treatment and her next injection soon.  At this time she endorses left thigh pain denies any numbness or tingling in the lower leg or any focal motor defects.  Denies any head strike or loss of consciousness.  Past Medical History:  Diagnosis Date   Arthritis    Atrophic vaginitis    Chronic kidney disease    Coronary artery disease    GERD (gastroesophageal reflux disease)    Hematuria    microscopic   Hepatitis    Hyperlipidemia    Hypertension    Kidney stone    Peripheral vascular disease (HCC)    Portal vein thrombosis    Past Surgical History:  Procedure Laterality Date   BREAST BIOPSY Right 04/27/2020   [place] Bx, Coil Clip, BENIGN MAMMARY PARENCHYMA WITH FIBROCYSTIC  AND FOCAL APOCRINE    CERVICAL SPINE SURGERY     ruptured disc   patient states never any surgery for this   COLONOSCOPY WITH PROPOFOL N/A 10/12/2015   Procedure: COLONOSCOPY WITH PROPOFOL;  Surgeon: Scot Jun, MD;  Location: [facility] ENDOSCOPY;  Service: Endoscopy;  Laterality: N/A;   COLONOSCOPY WITH PROPOFOL N/A 10/01/2023   Procedure: COLONOSCOPY WITH PROPOFOL;  Surgeon: Toledo, Boykin Nearing, MD;  Location: [facility] ENDOSCOPY;  Service: Gastroenterology;  Laterality: N/A;   HEEL SPUR SURGERY     LIP REPAIR N/A    TONSILLECTOMY     Social History   Socioeconomic History   Marital status: Widowed    Spouse name: Not on file   Number of children: Not on file   Years of education: Not on file   Highest education level: Not on file  Occupational History   Not on file  Tobacco Use   Smoking status: Never   Smokeless tobacco: Never  Vaping Use   Vaping status: Never Used  Substance and Sexual Activity   Alcohol use: No   Drug use: No   Sexual activity: Not on file  Other Topics Concern   Not on file  Social History Narrative   Not on file   Social Drivers of Health   Financial Resource Strain: Not on file  Food Insecurity: No Food Insecurity (12/11/2023)   Hunger Vital Sign    Worried About Running Out of Food in the Last Year: Never true    Ran Out of Food in the Last Year: Never true  Transportation Needs: Patient Declined (12/11/2023)   PRAPARE - Transportation    Lack of Transportation (Medical): Patient declined    Lack of  Transportation (Non-Medical): Patient declined  Physical Activity: Not on file  Stress: Not on file  Social Connections: Unknown (12/11/2023)   Social Connection and Isolation Panel [NHANES]    Frequency of Communication with Friends and Family: Never    Frequency of Social Gatherings with Friends and Family: Never    Attends Religious Services: Never    Diplomatic Services operational officer: Patient declined    Attends Engineer, structural: Patient declined    Marital Status: Not on file   Family History  Problem Relation Age of Onset   Arrhythmia Mother        pacemaker placed   Transient ischemic attack Father    Prostate cancer Brother     Bladder Cancer Neg Hx    Kidney cancer Neg Hx    Breast cancer Neg Hx    Allergies  Allergen Reactions   Amlodipine    Avelox [Moxifloxacin Hcl In Nacl]    Bextra [Valdecoxib]    Chlorzoxazone    Fosamax [Alendronate Sodium]    Guaifenesin & Derivatives    Lodine [Etodolac]    Methocarbamol Other (See Comments)    GI upset   Penicillins    Prednisone     Other reaction(s): Other (See Comments) flushing   Prior to Admission medications   Medication Sig Start Date End Date Taking? Authorizing Provider  acetaminophen (TYLENOL) 325 MG tablet Take 650 mg by mouth every 8 (eight) hours as needed.    [provider]  CALCIUM PO Take by mouth.    [provider]  celecoxib (CELEBREX) 200 MG capsule Take 200 mg by mouth daily.    [provider]  Cholecalciferol (DIALYVITE VITAMIN D 5000) 125 MCG (5000 UT) capsule Take 5,000 Units by mouth daily.    [provider]  cyanocobalamin 1000 MCG tablet Take 100 mcg by mouth daily.    [provider]  cyclobenzaprine (FLEXERIL) 10 MG tablet Take by mouth. 12/03/22   [provider]  gabapentin (NEURONTIN) 100 MG capsule Take 100 mg by mouth 3 (three) times daily. 12/04/22   [provider]  isosorbide mononitrate (IMDUR) 30 MG 24 hr tablet Take 15 mg by mouth daily.    [provider]  loratadine (CLARITIN) 10 MG tablet Take 10 mg by mouth daily.    [provider]  losartan (COZAAR) 50 MG tablet Take 50 mg by mouth daily.    [provider]  nebivolol (BYSTOLIC) 10 MG tablet Take by mouth. 01/09/17   [provider]  omega-3 acid ethyl esters (LOVAZA) 1 G capsule Take 1 g by mouth 2 (two) times daily.    [provider]  pantoprazole (PROTONIX) 40 MG tablet Take 40 mg by mouth daily.    [provider]  Propylene Glycol 0.6 % SOLN Place 1 drop into both eyes daily.    [provider]  rosuvastatin (CRESTOR) 5 MG tablet  Take 5 mg by mouth daily.    [provider]  valsartan (DIOVAN) 320 MG tablet Take 320 mg by mouth daily. 11/29/22   [provider]  Vitamin D, Ergocalciferol, 50000 units CAPS Take 1 capsule by mouth 2 (two) times a week. 12/31/22   [provider]  warfarin (COUMADIN) 2 MG tablet Take 2 mg by mouth daily. 1mg , Sun, Wed, Sat 2mg , Mon, Tues, Thur, Fri    [provider]   CT HEAD WO CONTRAST ( ) Result Date: 12/11/2023 CLINICAL DATA:  Head trauma, minor (Age >=  65y) fall (lowered to ground) resulting in L leg shortening with lateral rotation - pulses and cap refill intact at this time. When being lowered to the ground pt heard pop. Did not hit head. Takes coumadin. EXAM: CT HEAD WITHOUT CONTRAST TECHNIQUE: Contiguous axial images were obtained from the base of the skull through the vertex without intravenous contrast. RADIATION DOSE REDUCTION: This exam was performed according to the departmental dose-optimization program which includes automated exposure control, adjustment of the mA and/or kV according to patient size and/or use of iterative reconstruction technique. COMPARISON:  CT head 03/02/2023. FINDINGS: Brain: No evidence of large-territorial acute infarction. No parenchymal hemorrhage. No mass lesion. No extra-axial collection. No mass effect or midline shift. No hydrocephalus. Basilar cisterns are patent. Vascular: No hyperdense vessel. Atherosclerotic calcifications are present within the cavernous internal carotid and vertebral arteries. Skull: No acute fracture or focal lesion. Sinuses/Orbits: Right sphenoid sinus mucosal thickening and chronic retention cyst. Otherwise paranasal sinuses and mastoid air cells are clear. The orbits are unremarkable. Other: None. IMPRESSION: No acute intracranial abnormality. Electronically Signed   By: Tish Frederickson M.D.   On: 12/11/2023 00:39   CT FEMUR LEFT WO CONTRAST Result Date: 12/10/2023 CLINICAL DATA:  Left femur  fracture EXAM: CT OF THE LOWER LEFT EXTREMITY WITHOUT CONTRAST TECHNIQUE: Multidetector CT imaging of the lower left extremity was performed according to the standard protocol. RADIATION DOSE REDUCTION: This exam was performed according to the departmental dose-optimization program which includes automated exposure control, adjustment of the mA and/or kV according to patient size and/or use of iterative reconstruction technique. COMPARISON:  Left femur x-ray 12/10/2023 FINDINGS: Bones/Joint/Cartilage There is an acute transverse fracture through the proximal 1/3 of the femoral diaphysis. The distal fracture fragment is rotated externally in displaced posterior medially to the proximal fracture fragment 1.5 shaft width. There is 5 cm of overlap. There is no dislocation. No significant knee or hip joint effusion identified. Ligaments Suboptimally assessed by CT. Muscles and Tendons There is intramuscular edema surrounding the fracture site. No focal hematoma identified. Soft tissues Peripheral vascular calcifications are present. No focal hematoma or foreign body. Varicose veins are seen in the proximal calf. Sigmoid colon diverticula are present. IMPRESSION: Acute displaced and rotated fracture of the proximal femoral diaphysis. Electronically Signed   By: Darliss Cheney M.D.   On: 12/10/2023 21:11   DG Femur Min 2 Views Left Result Date: 12/10/2023 CLINICAL DATA:  Recent fall with left thigh deformity and foreshortening, initial encounter EXAM: LEFT FEMUR 2 VIEWS COMPARISON:  None Available. FINDINGS: Mid to distal diaphyseal fracture is noted in the left femur. Bony overlap is noted as previously described. No other focal abnormality is seen. IMPRESSION: Left femoral fracture with bony overlap. Electronically Signed   By: Alcide Clever M.D.   On: 12/10/2023 20:15   DG Hip Unilat W or Wo Pelvis 2-3 Views Left Result Date: 12/10/2023 CLINICAL DATA:  Recent fall with left hip pain, initial encounter EXAM: DG HIP  (WITH OR WITHOUT PELVIS) 3V LEFT COMPARISON:  None Available. FINDINGS: Pelvic ring is intact. Left femoral head is well seated. There is a mildly oblique fracture through the proximal to mid femoral shaft with proximally 4 cm of bony overlap at the fracture site. IMPRESSION: Left femoral diaphyseal fracture with significant bony overlap at the fracture site. Electronically Signed   By: Alcide Clever M.D.   On: 12/10/2023 20:14    Positive ROS: All other systems have been reviewed and were otherwise negative with the exception  of those mentioned in the HPI and as above.  Physical Exam: General:  Alert, no acute distress Psychiatric:  Patient is competent for consent with normal mood and affect   Cardiovascular:  No pedal edema Respiratory:  No wheezing, non-labored breathing GI:  Abdomen is soft and non-tender Skin:  No lesions in the area of chief complaint Neurologic:  Sensation intact distally Lymphatic:  No axillary or cervical lymphadenopathy  Orthopedic Exam:  Left lower extremity Shortened and externally rotated Skin intact with swelling to the thigh but compartments all soft No tenderness over the knee, tibia, foot or ankle Neurovascular intact able dorsiflex and plantarflex the foot with an intact posterior tibialis pulse  Secondary survey No tenderness to palpation over other bony prominences in the lower extremities or bilateral upper extremities No pain with logroll or simulated axial loading of the right lower extremity All compartments soft No tenderness to palpation over the cervical or thoracic spine, no bony step-off Motor grossly intact throughout, no focal deficits Sensation grossly intact throughout, no focal deficits Good distal pulses and capillary refill on all extremities   X-rays:  X-rays and CT scans images and report reviewed myself of the left femur show a displaced midshaft femoral shaft fracture with significant displacement.  There is some significant  cortical thickening around the area of the fracture but no signs of any pathologic lesions or any soft tissue lesions on the CT scan.  No evidence of any femoral neck fracture on the ipsilateral side.  Agree with radiology interpretation  Assessment: Left femoral shaft fracture  Plan: Jarrod is an 83 year old female presents with a left femoral shaft fracture with significant displacement.  I reviewed the clinical and radiographic findings with the patient and her family it is possible that this could be related to her bisphosphonate use given the atypical nature of the fracture pattern with low energy mechanism.  There is no concerning lesions on CT scan which would preclude surgical intervention at this time.  We discussed operative and nonoperative options under shared decision making model the patient and family elected to proceed with surgical intervention for her left femur.  The patient will need a intramedullary nail.  A long discussion took place with the patient describing what a intramedullary nail is and what the procedure would entail. The xrays were reviewed with the patient and the implants were discussed. The ability to secure the implant utilizing screw blade fixation was discussed. Surgical exposures were discussed with the patient.  We also discussed that he may open the fracture site in order to get a better reduction if necessary.   The hospitalization and post-operative care and rehabilitation were also discussed. The use of perioperative antibiotics and DVT prophylaxis were discussed. The risk, benefits and alternatives to a surgical intervention were discussed at length with the patient. The patient was also advised of risks related to the medical comorbidities. A lengthy discussion took place to review the most common complications including but not limited to: deep vein thrombosis, pulmonary embolus, heart attack, stroke, infection, wound breakdown, dislocation, numbness, leg length  in-equality, damage to nerves, intraoperative fracture, wear cut out or failure, malunion/ nonunion, tendon,muscles, arteries or other blood vessels, death and other possible complications from anesthesia. The patient was told that we will take steps to minimize these risks by using sterile technique, antibiotics and DVT prophylaxis when appropriate and follow the patient postoperatively in the office setting to monitor progress. The possibility of recurrent pain, no improvement in pain and actual  worsening of pain were also discussed with the patient.      The benefits of surgery were discussed with the patient including the potential for improving the patient's current clinical condition through operative intervention. Alternatives to surgical intervention including conservative management were also discussed in detail. All questions were answered to the satisfaction of the patient and family. The patient participated and agreed to the plan of care as well as the use of the recommended implants for their surgery.    Plan for surgery today 12/11/2023 left femur open reduction internal fixation with intramedullary nail N.p.o. for the operating room Hold anticoagulation  Consent signed and on the chart Will mark the leg in preop holding      Reinaldo Berber MD  Beeper #:  [id number]  12/11/2023 1:13 PM

## 2023-12-11 NOTE — Consult Note (Signed)
Pharmacy Consult Note - Anticoagulation  Pharmacy Consult for warfarin Indication:  h/o portal vein thrombosis  PATIENT MEASUREMENTS: Height: 5' (152.4 cm) Weight: 68 kg (149 lb 14.6 oz) IBW/kg (Calculated) : 45.5 HEPARIN DW (KG): 60.2  VITAL SIGNS: Temp: 97 F (36.1 C) (01/29 1719) Temp Source: Temporal (01/29 1359) BP: 118/76 (01/29 1745) Pulse Rate: 66 (01/29 1745)  Recent Labs    12/10/23 2123 12/11/23 0408 12/11/23 1006  HGB 10.9*  --  10.8*  HCT 30.9*  --  31.3*  PLT 205  --  213  APTT 37*  --   --   LABPROT 22.3* 20.6*  --   INR 1.9* 1.7*  --   CREATININE 1.05*  --  0.82    Estimated Creatinine Clearance: 45.5 mL/min (by C-G formula based on SCr of 0.82 mg/dL).  PAST MEDICAL HISTORY: Past Medical History:  Diagnosis Date   Arthritis    Atrophic vaginitis    Chronic kidney disease    Coronary artery disease    GERD (gastroesophageal reflux disease)    Hematuria    microscopic   Hepatitis    Hyperlipidemia    Hypertension    Kidney stone    Peripheral vascular disease (HCC)    Portal vein thrombosis     ASSESSMENT: 83 y.o. female with PMH including portal vein thrombosis is presenting with femoral fracture. Patient is on chronic anticoagulation with warfarin per chart review. Last dose unknown and patient currently in PACU. Her INR was slightly subtherapeutic at 1.9 upon presentation, and patient did receive vitamin K 2 mg IV x 1 prior to orthopedic surgery. Pharmacy has been consulted to initiate and manage heparin intravenous infusion.  Pertinent medications: Warfarin 2 mg Mon, Tues, Thurs, Fri Warfarin 1 mg Sun, Wed, Sat Total weekly warfarin = 11 mg  New drug-drug interactions:   Goal(s) of therapy: INR 2 - 3 Monitor platelets by anticoagulation protocol: Yes   Baseline anticoagulation labs: Recent Labs    12/10/23 2123 12/11/23 0408 12/11/23 1006  APTT 37*  --   --   INR 1.9* 1.7*  --   HGB 10.9*  --  10.8*  PLT 205  --  213        PLAN: Administer warfarin 3 mg PO x 1 tonight Barring further changes, this will result in 10% more warfarin this week which is appropriate because of patient received low dose of IV vitamin K and patient's INR slightly subtherapeutic upon presentation Daily INR Continue to monitor for new drug-drug interactions CBC at least every 7 days for patients on warfarin  Will M. Dareen Piano, PharmD Clinical Pharmacist 12/11/2023 6:03 PM

## 2023-12-11 NOTE — Anesthesia Procedure Notes (Signed)
Procedure Name: Intubation Date/Time: 12/11/2023 3:12 PM  Performed by: Marcy Siren, RNPre-anesthesia Checklist: Patient identified, Emergency Drugs available, Suction available and Patient being monitored Patient Re-evaluated:Patient Re-evaluated prior to induction Oxygen Delivery Method: Circle system utilized Preoxygenation: Pre-oxygenation with 100% oxygen Induction Type: IV induction Ventilation: Mask ventilation without difficulty Laryngoscope Size: McGrath and 3 Grade View: Grade I Tube type: Oral Tube size: 6.5 mm Number of attempts: 1 Airway Equipment and Method: Stylet Placement Confirmation: ETT inserted through vocal cords under direct vision, positive ETCO2 and breath sounds checked- equal and bilateral Secured at: 22 cm Tube secured with: Tape Dental Injury: Teeth and Oropharynx as per pre-operative assessment

## 2023-12-11 NOTE — Progress Notes (Signed)
Initial Nutrition Assessment  DOCUMENTATION CODES:   Not applicable  INTERVENTION:  - Magic cup TID with meals, each supplement provides 290 kcal and 9 grams of protein once diet advances -MVI with minerals daily  NUTRITION DIAGNOSIS:   Increased nutrient needs related to post-op healing as evidenced by estimated needs.  GOAL:   Patient will meet greater than or equal to 90% of their needs  MONITOR:   Diet advancement, Supplement acceptance  REASON FOR ASSESSMENT:   Consult Assessment of nutrition requirement/status  ASSESSMENT:     Pt admitted for left femur fracture after fall at home. PMH of HTN, HLD, diastolic CHF, GERD, CKD-3, portal vein thrombosis, carotid artery stenosis, skin cancer.  1/29: Plan for surgery (left femur open reduction internal fixation)  Pt's daughter at bedside during visit. Pt NPO for surgery this afternoon. Pt reports appetite at home is very good. She reports doing a mix of cooking and eating frozen meals. She reports eating 3 meals a day and a snack before bedtime. Food recall includes B: quiche, activa yogurt. L: Rice, green beans, ham. D: eggs, toast, frozen meals. S: chocolate ice cream, fruit. Pt reports experiencing lactose intolerance in recent years but symptoms tend to just be gas. She reports taking lactacid pills and eating lactaid products in the past but no longer does. Pt reports taking vitamin B12 and D3 at home. Discussed adding a magic cup once diet advances to see how she tolerates the lactose. Pt agreeable to this as she often will eat lactose containing ice cream at home. She was hesitant to try the Ensure as she was afraid she wouldn't be able to tolerate it.   Pt reports a usual weight of 148-150 lbs. She reports being on steroids for her back since last month and feeling increased hunger from being on the medications. She believes to have gained 2-3 lbs from eating more with her increased appetite. Per chart review she has been  between 149-152 lbs since 2019.   Pt lives at home alone and is independent. She reports using a cane as needed at home.  Meds: Sodium chloride tablet 1g BID, Senokot PRN  Labs: Sodium & chloride low, Glucose 127  NUTRITION - FOCUSED PHYSICAL EXAM:  Flowsheet Row Most Recent Value  Orbital Region No depletion  Upper Arm Region Moderate depletion  Thoracic and Lumbar Region No depletion  Buccal Region No depletion  Temple Region Mild depletion  Clavicle Bone Region No depletion  Clavicle and Acromion Bone Region No depletion  Scapular Bone Region No depletion  Dorsal Hand Mild depletion  Patellar Region No depletion  Anterior Thigh Region No depletion  Posterior Calf Region No depletion  Edema (RD Assessment) None  Hair Reviewed  Eyes Reviewed  Mouth Reviewed  Skin Reviewed  Nails Reviewed       Diet Order:   Diet Order             Diet NPO time specified Except for: Sips with Meds  Diet effective midnight                   EDUCATION NEEDS:   Education needs have been addressed  Skin:  Skin Assessment: Reviewed RN Assessment  Last BM:  1/28  Height:   Ht Readings from Last 1 Encounters:  12/10/23 5' (1.524 m)    Weight:   Wt Readings from Last 1 Encounters:  12/10/23 68 kg    Ideal Body Weight:  47.7 kg  BMI:  Body  mass index is 29.29 kg/m.  Estimated Nutritional Needs:   Kcal:  1650-1850  Protein:  85-100 g  Fluid:  > 1.5 L    Maceo Pro, MS Dietetic Intern

## 2023-12-12 ENCOUNTER — Encounter: Payer: Self-pay | Admitting: Orthopedic Surgery

## 2023-12-12 DIAGNOSIS — S72302A Unspecified fracture of shaft of left femur, initial encounter for closed fracture: Secondary | ICD-10-CM | POA: Diagnosis not present

## 2023-12-12 LAB — CBC
HCT: 27.7 % — ABNORMAL LOW (ref 36.0–46.0)
Hemoglobin: 9.4 g/dL — ABNORMAL LOW (ref 12.0–15.0)
MCH: 33.1 pg (ref 26.0–34.0)
MCHC: 33.9 g/dL (ref 30.0–36.0)
MCV: 97.5 fL (ref 80.0–100.0)
Platelets: 208 10*3/uL (ref 150–400)
RBC: 2.84 MIL/uL — ABNORMAL LOW (ref 3.87–5.11)
RDW: 14 % (ref 11.5–15.5)
WBC: 6.2 10*3/uL (ref 4.0–10.5)
nRBC: 0 % (ref 0.0–0.2)

## 2023-12-12 LAB — BASIC METABOLIC PANEL
Anion gap: 8 (ref 5–15)
BUN: 13 mg/dL (ref 8–23)
CO2: 26 mmol/L (ref 22–32)
Calcium: 8.4 mg/dL — ABNORMAL LOW (ref 8.9–10.3)
Chloride: 100 mmol/L (ref 98–111)
Creatinine, Ser: 1.05 mg/dL — ABNORMAL HIGH (ref 0.44–1.00)
GFR, Estimated: 53 mL/min — ABNORMAL LOW (ref >60)
Glucose, Bld: 116 mg/dL — ABNORMAL HIGH (ref 70–99)
Potassium: 4.2 mmol/L (ref 3.5–5.1)
Sodium: 134 mmol/L — ABNORMAL LOW (ref 135–145)

## 2023-12-12 LAB — PROTIME-INR
INR: 1.3 — ABNORMAL HIGH (ref 0.8–1.2)
Prothrombin Time: 16.3 s — ABNORMAL HIGH (ref 11.4–15.2)

## 2023-12-12 MED ORDER — ENOXAPARIN SODIUM 40 MG/0.4ML IJ SOSY
40.0000 mg | PREFILLED_SYRINGE | Freq: Once | INTRAMUSCULAR | Status: AC
Start: 2023-12-12 — End: 2023-12-12
  Administered 2023-12-12: 40 mg via SUBCUTANEOUS
  Filled 2023-12-12: qty 0.4

## 2023-12-12 MED ORDER — WARFARIN SODIUM 5 MG PO TABS
5.0000 mg | ORAL_TABLET | Freq: Once | ORAL | Status: AC
  Administered 2023-12-12: 5 mg via ORAL
  Filled 2023-12-12: qty 1

## 2023-12-12 MED ORDER — POLYETHYLENE GLYCOL 3350 17 G PO PACK
34.0000 g | PACK | Freq: Every day | ORAL | Status: DC
  Administered 2023-12-13 – 2023-12-15 (×2): 34 g via ORAL
  Filled 2023-12-12 (×6): qty 2

## 2023-12-12 MED ORDER — ENOXAPARIN SODIUM 80 MG/0.8ML IJ SOSY
70.0000 mg | PREFILLED_SYRINGE | Freq: Two times a day (BID) | INTRAMUSCULAR | Status: DC
  Administered 2023-12-12 – 2023-12-14 (×4): 70 mg via SUBCUTANEOUS
  Filled 2023-12-12 (×4): qty 0.7

## 2023-12-12 MED ORDER — POLYETHYLENE GLYCOL 3350 17 G PO PACK
17.0000 g | PACK | Freq: Once | ORAL | Status: AC
  Administered 2023-12-12: 17 g via ORAL
  Filled 2023-12-12: qty 1

## 2023-12-12 MED ORDER — ENOXAPARIN SODIUM 30 MG/0.3ML IJ SOSY
30.0000 mg | PREFILLED_SYRINGE | INTRAMUSCULAR | Status: DC
  Administered 2023-12-12: 30 mg via SUBCUTANEOUS
  Filled 2023-12-12: qty 0.3

## 2023-12-12 NOTE — Evaluation (Signed)
Physical Therapy Evaluation Patient Details Name: Mary Buckley MRN: [id number] DOB: February 24, 1941 Today's Date: 12/12/2023  History of Present Illness  Mary Buckley is a 83 y.o. 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 Found to have a left femoral shaft fracture now s/p IM nail 12/11/23.  Clinical Impression  Pt is a pleasant 83 year old female who was admitted for L femur fx and is s/p IM nailing on 12/11/23. Pt is POD 1 at time of evaluation. Pt performs bed mobility with max assist, transfers with mod assist, and ambulation with mod assist and RW. Pt demonstrates deficits with strength/mobility/pain. Pt is currently not at baseline level and is willing and motivated to participate. Would benefit from skilled PT to address above deficits and promote optimal return to PLOF. Pt will continue to receive skilled PT services while admitted and will defer to TOC/care team for updates regarding disposition planning.         If plan is discharge home, recommend the following: A lot of help with walking and/or transfers;A lot of help with bathing/dressing/bathroom;Assist for transportation;Help with stairs or ramp for entrance   Can travel by private vehicle   No    Equipment Recommendations Rolling walker (2 wheels)  Recommendations for Other Services       Functional Status Assessment Patient has had a recent decline in their functional status and demonstrates the ability to make significant improvements in function in a reasonable and predictable amount of time.     Precautions / Restrictions Precautions Precautions: Fall Restrictions Weight Bearing Restrictions Per Provider Order: Yes LLE Weight Bearing Per Provider Order: Weight bearing as tolerated      Mobility  Bed Mobility Overal bed mobility:  Needs Assistance Bed Mobility: Sit to Supine       Sit to supine: Max assist   General bed mobility comments: needs assist for B LE management and repositioning in bed. Utilized bed functions to assist with scooting in bed    Transfers Overall transfer level: Needs assistance Equipment used: Rolling walker (2 wheels) Transfers: Sit to/from Stand Sit to Stand: Mod assist           General transfer comment: needs assist for scooting out towards EOB and cues for hand placement. Once standing, upright posture noted    Ambulation/Gait Ambulation/Gait assistance: Mod assist Gait Distance (Feet): 10 Feet Assistive device: Rolling walker (2 wheels) Gait Pattern/deviations: Step-to pattern       General Gait Details: ambulated to wall and back in room. Needs step by step sequencing to coordinate RW and WBing heavily on RW secondary to pain. Fatigues quickly  Careers information officer     Tilt Bed    Modified Rankin (Stroke Patients Only)       Balance Overall balance assessment: Needs assistance Sitting-balance support: No upper extremity supported, Feet supported Sitting balance-Leahy Scale: Good     Standing balance support: Bilateral upper extremity supported Standing balance-Leahy Scale: Fair                               Pertinent Vitals/Pain Pain Assessment Pain Assessment: 0-10 Pain Score: 5  Pain Location: L thigh Pain Descriptors / Indicators: Discomfort, Grimacing, Operative site guarding Pain Intervention(s): Limited activity within  patient's tolerance, Repositioned    Home Living Family/patient expects to be discharged to:: Private residence Living Arrangements: Alone Available Help at Discharge: Family Type of Home: House Home Access: Stairs to enter Entrance Stairs-Rails: Left Entrance Stairs-Number of Steps: 4   Home Layout: One level Home Equipment: Cane - single point;Grab bars - tub/shower      Prior  Function Prior Level of Function : Independent/Modified Independent             Mobility Comments: reports she has been using SPC for mobility recently ADLs Comments: indep     Extremity/Trunk Assessment   Upper Extremity Assessment Upper Extremity Assessment: Overall WFL for tasks assessed    Lower Extremity Assessment Lower Extremity Assessment: Generalized weakness (L LE grossly 3/5; R LE grossly 4/5)       Communication   Communication Communication: No apparent difficulties  Cognition Arousal: Alert Behavior During Therapy: WFL for tasks assessed/performed Overall Cognitive Status: Within Functional Limits for tasks assessed                                 General Comments: pleasant and agreeable to session        General Comments      Exercises Other Exercises Other Exercises: seated ther-ex performed on B LE including AP, LAQ, and marching. 10 reps with min assist   Assessment/Plan    PT Assessment Patient needs continued PT services  PT Problem List Decreased strength;Decreased activity tolerance;Decreased balance;Decreased mobility;Pain       PT Treatment Interventions DME instruction;Gait training;Therapeutic exercise;Balance training    PT Goals (Current goals can be found in the Care Plan section)  Acute Rehab PT Goals Patient Stated Goal: to go home PT Goal Formulation: With patient Time For Goal Achievement: 12/26/23 Potential to Achieve Goals: Good    Frequency 7X/week     Co-evaluation               AM-PAC PT "6 Clicks" Mobility  Outcome Measure Help needed turning from your back to your side while in a flat bed without using bedrails?: A Lot Help needed moving from lying on your back to sitting on the side of a flat bed without using bedrails?: A Lot Help needed moving to and from a bed to a chair (including a wheelchair)?: A Lot Help needed standing up from a chair using your arms (e.g., wheelchair or bedside  chair)?: A Little Help needed to walk in hospital room?: A Lot Help needed climbing 3-5 steps with a railing? : Total 6 Click Score: 12    End of Session Equipment Utilized During Treatment: Gait belt Activity Tolerance: Patient limited by pain Patient left: in bed;with bed alarm set;with SCD's reapplied Nurse Communication: Mobility status PT Visit Diagnosis: Muscle weakness (generalized) (M62.81);History of falling (Z91.81);Difficulty in walking, not elsewhere classified (R26.2);Pain Pain - Right/Left: Left Pain - part of body: Hip    Time: 0981-1914 PT Time Calculation (min) (ACUTE ONLY): 32 min   Charges:   PT Evaluation $PT Eval Low Complexity: 1 Low PT Treatments $Gait Training: 8-22 mins PT General Charges $$ ACUTE PT VISIT: 1 Visit         Elizabeth Palau, PT, DPT, GCS [phone]   Makana Rostad 12/12/2023, 4:09 PM

## 2023-12-12 NOTE — Plan of Care (Signed)

## 2023-12-12 NOTE — Progress Notes (Signed)
   Subjective: 1 Day Post-Op Procedure(s) (LRB): INTRAMEDULLARY (IM) NAIL INTERTROCHANTERIC (Left) Patient reports pain as mild.   Patient is well, and has had no acute complaints or problems Denies any CP, SOB, ABD pain. We will continue therapy today.  Plan is to go Home after hospital stay.  Objective: Vital signs in last 24 hours: Temp:  [97 F (36.1 C)-99 F (37.2 C)] 99 F (37.2 C) (01/30 0427) Pulse Rate:  [61-84] 76 (01/30 0427) Resp:  [13-20] 20 (01/30 0427) BP: (99-133)/(58-79) 128/62 (01/30 0427) SpO2:  [95 %-100 %] 96 % (01/30 0427) Weight:  [68 kg] 68 kg (01/29 1359)  Intake/Output from previous day: 01/29 0701 - 01/30 0700 In: 1340 [P.O.:240; I.V.:900; IV Piggyback:200] Out: 2300 [Urine:2250; Blood:50] Intake/Output this shift: No intake/output data recorded.  Recent Labs    12/10/23 2123 12/11/23 1006 12/12/23 0410  HGB 10.9* 10.8* 9.4*   Recent Labs    12/11/23 1006 12/12/23 0410  WBC 5.2 6.2  RBC 3.23* 2.84*  HCT 31.3* 27.7*  PLT 213 208   Recent Labs    12/11/23 1006 12/12/23 0410  NA 133* 134*  K 4.2 4.2  CL 101 100  CO2 23 26  BUN 11 13  CREATININE 0.82 1.05*  GLUCOSE 106* 116*  CALCIUM 8.6* 8.4*   Recent Labs    12/11/23 0408 12/12/23 0410  INR 1.7* 1.3*    EXAM General - Patient is Alert, Appropriate, and Oriented Extremity - Neurovascular intact Sensation intact distally Intact pulses distally Dorsiflexion/Plantar flexion intact Dressing - dressing C/D/I and no drainage Motor Function - intact, moving foot and toes well on exam.   Past Medical History:  Diagnosis Date   Arthritis    Atrophic vaginitis    Chronic kidney disease    Coronary artery disease    GERD (gastroesophageal reflux disease)    Hematuria    microscopic   Hepatitis    Hyperlipidemia    Hypertension    Kidney stone    Peripheral vascular disease (HCC)    Portal vein thrombosis     Assessment/Plan:   1 Day Post-Op Procedure(s)  (LRB): INTRAMEDULLARY (IM) NAIL INTERTROCHANTERIC (Left) Principal Problem:   Femur fracture, left (HCC) Active Problems:   Benign essential HTN   Deep vein thrombosis of portal vein   HLD (hyperlipidemia)   Chronic diastolic CHF (congestive heart failure) (HCC)   Chronic kidney disease, stage 3a (HCC)   Overweight (BMI 25.0-29.9)   Fall at home, initial encounter   Hyponatremia  Estimated body mass index is 29.28 kg/m as calculated from the following:   Height as of this encounter: 5' (1.524 m).   Weight as of this encounter: 68 kg. Advance diet Up with therapy Pain well controlled Labs and VSS INR 1.3, bridge with lovenox CM to assist with discharge  DVT Prophylaxis - Lovenox and Coumadin TEDS SCDs Weight-Bearing as tolerated to left leg   T. Cranston Neighbor, PA-C [facility] 12/12/2023, 7:36 AM

## 2023-12-12 NOTE — Evaluation (Addendum)
Occupational Therapy Evaluation Patient Details Name: Lamari Youngers MRN: [id number] DOB: [birth year] Today's Date: 12/12/2023   History of Present Illness Arrow Emmerich is a 83 y.o. 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 Found to have a left femoral shaft fracture now s/p IM nail 12/11/23.   Clinical Impression   Ms Drum was seen for OT evaluation this date. Prior to hospital admission, pt was MOD I using SPC. Pt lives alone. Pt currently requires MIN A exit bed, good sitting balance. MIN A + RW for bed>chair t/f, assist for weight shifting. MOD A chair<>BSC t/f and MAX A pericare. Increased assist provided as pt reports + dizziness and flushed - BP checked and demonstrates + orthostatics. MD/RN notified. Pt would benefit from skilled OT to address noted impairments and functional limitations (see below for any additional details). Upon hospital discharge, recommend OT follow up <3 hours/day.  Supine: BP 112/59, HR 88 Seated: BP 92/67, HR 97 Standing: Unable to tolerate    If plan is discharge home, recommend the following: A lot of help with walking and/or transfers;A lot of help with bathing/dressing/bathroom    Functional Status Assessment  Patient has had a recent decline in their functional status and demonstrates the ability to make significant improvements in function in a reasonable and predictable amount of time.  Equipment Recommendations  BSC/3in1    Recommendations for Other Services       Precautions / Restrictions Precautions Precautions: Fall Restrictions Weight Bearing Restrictions Per Provider Order: Yes LLE Weight Bearing Per Provider Order: Weight bearing as tolerated      Mobility Bed Mobility Overal bed mobility: Needs Assistance Bed Mobility: Supine to Sit      Supine to sit: Min assist          Transfers Overall transfer level: Needs assistance Equipment used: Rolling walker (2 wheels) Transfers: Sit to/from Stand, Bed to chair/wheelchair/BSC Sit to Stand: Min assist Stand pivot transfers: Mod assist   Step pivot transfers: Min assist            Balance Overall balance assessment: Needs assistance Sitting-balance support: No upper extremity supported, Feet supported Sitting balance-Leahy Scale: Good     Standing balance support: Bilateral upper extremity supported Standing balance-Leahy Scale: Poor                             ADL either performed or assessed with clinical judgement   ADL Overall ADL's : Needs assistance/impaired                                       General ADL Comments: MIN A + RW for BSC t/f, MAX A pericare.      Pertinent Vitals/Pain Pain Assessment Pain Assessment: 0-10 Pain Score: 6  Pain Location: L thigh Pain Descriptors / Indicators: Discomfort, Grimacing, Operative site guarding Pain Intervention(s): Limited activity within patient's tolerance, Premedicated before session     Extremity/Trunk Assessment Upper Extremity Assessment Upper Extremity Assessment: Overall WFL for tasks assessed   Lower Extremity Assessment Lower Extremity Assessment: Generalized weakness       Communication Communication Communication: No apparent difficulties Cueing Techniques: Verbal cues   Cognition Arousal: Alert Behavior During Therapy: WFL for tasks assessed/performed Overall  Cognitive Status: Within Functional Limits for tasks assessed                                       General Comments       Exercises     Shoulder Instructions      Home Living Family/patient expects to be discharged to:: Private residence Living Arrangements: Alone Available Help at Discharge: Family Type of Home: House Home Access: Stairs to enter Water quality scientist of Steps: 4 Entrance Stairs-Rails: Left Home Layout: One level     Bathroom Shower/Tub: Tub/shower unit         Home Equipment: Cane - single point;Grab bars - tub/shower          Prior Functioning/Environment Prior Level of Function : Independent/Modified Independent                        OT Problem List: Decreased strength;Decreased range of motion;Decreased activity tolerance;Impaired balance (sitting and/or standing)      OT Treatment/Interventions: Self-care/ADL training;Therapeutic exercise;DME and/or AE instruction;Energy conservation;Therapeutic activities    OT Goals(Current goals can be found in the care plan section) Acute Rehab OT Goals Patient Stated Goal: to go home OT Goal Formulation: With patient/family Time For Goal Achievement: 12/26/23 Potential to Achieve Goals: Good ADL Goals Pt Will Perform Grooming: with modified independence;standing Pt Will Perform Lower Body Dressing: with modified independence;sit to/from stand Pt Will Transfer to Toilet: with modified independence;ambulating;regular height toilet  OT Frequency: Min 1X/week    Co-evaluation              AM-PAC OT "6 Clicks" Daily Activity     Outcome Measure Help from another person eating meals?: None Help from another person taking care of personal grooming?: A Little Help from another person toileting, which includes using toliet, bedpan, or urinal?: A Lot Help from another person bathing (including washing, rinsing, drying)?: A Lot Help from another person to put on and taking off regular upper body clothing?: None Help from another person to put on and taking off regular lower body clothing?: A Lot 6 Click Score: 17   End of Session Equipment Utilized During Treatment: Rolling walker (2 wheels) Nurse Communication: Mobility status  Activity Tolerance: Patient tolerated treatment well Patient left: in chair;with call bell/phone within reach;with  family/visitor present  OT Visit Diagnosis: Other abnormalities of gait and mobility (R26.89);Muscle weakness (generalized) (M62.81)                Time: 5784-6962 OT Time Calculation (min): 52 min Charges:  OT General Charges $OT Visit: 1 Visit OT Evaluation $OT Eval Moderate Complexity: 1 Mod OT Treatments $Self Care/Home Management : 38-52 mins  Kathie Dike, M.S. OTR/L  12/12/23, 10:51 AM  ascom [phone]

## 2023-12-12 NOTE — Progress Notes (Signed)
PROGRESS NOTE    Mary Buckley  ZOX:[id number] DOB: Dec 14, 1940 DOA: 12/10/2023 PCP: Marguarite Arbour, MD     Brief Narrative:   From admission h and p  Mary Buckley is a 83 y.o. 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   Patient states that she accidentally fell when she was walking at home.  Denies loss of consciousness.  No head or neck injury.  She developed pain in left upper leg and hip area, which is constant, sharp, severe, nonradiating, aggravated by movement.  No chest pain, cough, SOB.  No nausea, vomiting, diarrhea or abdominal pain.  No symptoms of UTI.  Assessment & Plan:   Principal Problem:   Femur fracture, left (HCC) Active Problems:   Fall at home, initial encounter   Deep vein thrombosis of portal vein   Benign essential HTN   HLD (hyperlipidemia)   Chronic diastolic CHF (congestive heart failure) (HCC)   Chronic kidney disease, stage 3a (HCC)   Overweight (BMI 25.0-29.9)   Hyponatremia  # Acute left femoral shaft fracture After fall, s/p operative repair with nail on 1/29 with Dr. Audelia Acton. - PT/OT, pain control, bowel regimen (no bm yet) - monitor hgb, small drop today but above transfusion threshold  # Orthostasis Today with OT - push fluids - hold home metop/imdur  # Portal vein thrombosis On coumadin at home, reversed prior to surgery - now on lovenox bridge to coumadin  # Hypoxia Resolved, cxr clear - monitor  # Osteoporosis On prolia outpt, follows w/ endo - will need endo f/u  # HTN Orthostatic as above - hold home imdur, bisoprolol  # Hyponatremia Essentially resolved - monitor  # Chronic pain - home gabapentin   DVT prophylaxis: lovenox/coumadin Code Status: full Family Communication: daughter updated @ bedside 1/30  Level of care:  Telemetry Medical Status is: Inpatient Remains inpatient appropriate because: severity of illness    Consultants:  orthopedics  Procedures: Operative repair  Antimicrobials:  Pre-operative    Subjective: Ongoing left leg pain mainly in thigh, stable, tolerating diet  Objective: Vitals:   12/11/23 2350 12/12/23 0427 12/12/23 0742 12/12/23 1156  BP: 131/79 128/62 135/73 105/61  Pulse: 84 76 79 79  Resp: 20 20 16 16   Temp: 98.7 F (37.1 C) 99 F (37.2 C) 99.7 F (37.6 C) 98.2 F (36.8 C)  TempSrc:    Oral  SpO2: 98% 96% 93% 99%  Weight:      Height:        Intake/Output Summary (Last 24 hours) at 12/12/2023 1233 Last data filed at 12/12/2023 1100 Gross per 24 hour  Intake 1580 ml  Output 1500 ml  Net 80 ml   Filed Weights   12/10/23 1931 12/11/23 1359  Weight: 68 kg 68 kg    Examination:  General exam: Appears calm and comfortable  Respiratory system: Clear to auscultation. Respiratory effort normal. Cardiovascular system: S1 & S2 heard, RRR.  Gastrointestinal system: Abdomen is nondistended, soft and nontender  Central nervous system: Alert and oriented. No focal neurological deficits. Extremities: warm, distal sensation intact, 2 bandages left upper extremity c/d/i Skin: No rashes, lesions or ulcers Psychiatry: Judgement and insight appear normal. Mood & affect appropriate.     Data Reviewed: I have personally reviewed following labs and imaging studies  CBC: Recent Labs  Lab 12/10/23 2123 12/11/23 1006 12/12/23  0410  WBC 7.6 5.2 6.2  NEUTROABS 6.5  --   --   HGB 10.9* 10.8* 9.4*  HCT 30.9* 31.3* 27.7*  MCV 95.7 96.9 97.5  PLT 205 213 208   Basic Metabolic Panel: Recent Labs  Lab 12/10/23 2123 12/11/23 1006 12/12/23 0410  NA 128* 133* 134*  K 4.3 4.2 4.2  CL 88* 101 100  CO2 23 23 26   GLUCOSE 127* 106* 116*  BUN 16 11 13   CREATININE 1.05* 0.82 1.05*  CALCIUM 9.4 8.6* 8.4*   GFR: Estimated Creatinine Clearance: 35.5 mL/min (A)  (by C-G formula based on SCr of 1.05 mg/dL (H)). Liver Function Tests: No results for input(s): "AST", "ALT", "ALKPHOS", "BILITOT", "PROT", "ALBUMIN" in the last 168 hours. No results for input(s): "LIPASE", "AMYLASE" in the last 168 hours. No results for input(s): "AMMONIA" in the last 168 hours. Coagulation Profile: Recent Labs  Lab 12/10/23 2123 12/11/23 0408 12/12/23 0410  INR 1.9* 1.7* 1.3*   Cardiac Enzymes: No results for input(s): "CKTOTAL", "CKMB", "CKMBINDEX", "TROPONINI" in the last 168 hours. BNP (last 3 results) No results for input(s): "PROBNP" in the last 8760 hours. HbA1C: No results for input(s): "HGBA1C" in the last 72 hours. CBG: No results for input(s): "GLUCAP" in the last 168 hours. Lipid Profile: No results for input(s): "CHOL", "HDL", "LDLCALC", "TRIG", "CHOLHDL", "LDLDIRECT" in the last 72 hours. Thyroid Function Tests: No results for input(s): "TSH", "T4TOTAL", "FREET4", "T3FREE", "THYROIDAB" in the last 72 hours. Anemia Panel: No results for input(s): "VITAMINB12", "FOLATE", "FERRITIN", "TIBC", "IRON", "RETICCTPCT" in the last 72 hours. Urine analysis:    Component Value Date/Time   COLORURINE STRAW (A) 03/02/2023 0726   APPEARANCEUR CLEAR (A) 03/02/2023 0726   APPEARANCEUR Clear 01/01/2023 1333   LABSPEC 1.015 03/02/2023 0726   LABSPEC 1.003 10/01/2014 0607   PHURINE 6.0 03/02/2023 0726   GLUCOSEU NEGATIVE 03/02/2023 0726   GLUCOSEU Negative 10/01/2014 0607   HGBUR MODERATE (A) 03/02/2023 0726   BILIRUBINUR NEGATIVE 03/02/2023 0726   BILIRUBINUR Negative 01/01/2023 1333   BILIRUBINUR Negative 10/01/2014 0607   KETONESUR NEGATIVE 03/02/2023 0726   PROTEINUR NEGATIVE 03/02/2023 0726   NITRITE NEGATIVE 03/02/2023 0726   LEUKOCYTESUR NEGATIVE 03/02/2023 0726   LEUKOCYTESUR Trace 10/01/2014 0607   Sepsis Labs: @LABRCNTIP (procalcitonin:4,lacticidven:4)  )No results found for this or any previous visit (from the past 240 hours).        Radiology Studies: DG HIP UNILAT WITH PELVIS 2-3 VIEWS LEFT Result Date: 12/11/2023 CLINICAL DATA:  Elective surgery.  Intramedullary nail. EXAM: DG HIP (WITH OR WITHOUT PELVIS) 2-3V LEFT COMPARISON:  Left femur radiographs 12/10/2023 FINDINGS: Images were performed intraoperatively without the presence of a radiologist. New long intramedullary nail fixation of the previously seen complete transverse fracture of the proximal femoral diaphysis. Improved, now normal alignment. No hardware complication is seen. Total fluoroscopy images: 8 Total fluoroscopy time: 81 seconds Total dose: Radiation Exposure Index (as provided by the fluoroscopic device): 29.89 mGy air Kerma Please see intraoperative findings for further detail. IMPRESSION: Intraoperative fluoroscopy for proximal femoral diaphysis fracture fixation. Electronically Signed   By: Neita Garnet M.D.   On: 12/11/2023 18:41   DG Chest Port 1 View Result Date: 12/11/2023 CLINICAL DATA:  Hypoxia.  Preop. EXAM: PORTABLE CHEST 1 VIEW COMPARISON:  CT 03/02/2023 FINDINGS: The heart is normal in size. Aortic atherosclerosis and tortuosity. Otherwise normal mediastinal contours. Minor left lung base atelectasis. No pulmonary edema, pleural effusion, or pneumothorax. IMPRESSION: Minor left lung base atelectasis. Electronically Signed   By:  Narda Rutherford M.D.   On: 12/11/2023 17:44   DG C-Arm 1-60 Min-No Report Result Date: 12/11/2023 Fluoroscopy was utilized by the requesting physician.  No radiographic interpretation.   DG C-Arm 1-60 Min-No Report Result Date: 12/11/2023 Fluoroscopy was utilized by the requesting physician.  No radiographic interpretation.   CT HEAD WO CONTRAST ( ) Result Date: 12/11/2023 CLINICAL DATA:  Head trauma, minor (Age >= 65y) fall (lowered to ground) resulting in L leg shortening with lateral rotation - pulses and cap refill intact at this time. When being lowered to the ground pt heard pop. Did not hit head.  Takes coumadin. EXAM: CT HEAD WITHOUT CONTRAST TECHNIQUE: Contiguous axial images were obtained from the base of the skull through the vertex without intravenous contrast. RADIATION DOSE REDUCTION: This exam was performed according to the departmental dose-optimization program which includes automated exposure control, adjustment of the mA and/or kV according to patient size and/or use of iterative reconstruction technique. COMPARISON:  CT head 03/02/2023. FINDINGS: Brain: No evidence of large-territorial acute infarction. No parenchymal hemorrhage. No mass lesion. No extra-axial collection. No mass effect or midline shift. No hydrocephalus. Basilar cisterns are patent. Vascular: No hyperdense vessel. Atherosclerotic calcifications are present within the cavernous internal carotid and vertebral arteries. Skull: No acute fracture or focal lesion. Sinuses/Orbits: Right sphenoid sinus mucosal thickening and chronic retention cyst. Otherwise paranasal sinuses and mastoid air cells are clear. The orbits are unremarkable. Other: None. IMPRESSION: No acute intracranial abnormality. Electronically Signed   By: Tish Frederickson M.D.   On: 12/11/2023 00:39   CT FEMUR LEFT WO CONTRAST Result Date: 12/10/2023 CLINICAL DATA:  Left femur fracture EXAM: CT OF THE LOWER LEFT EXTREMITY WITHOUT CONTRAST TECHNIQUE: Multidetector CT imaging of the lower left extremity was performed according to the standard protocol. RADIATION DOSE REDUCTION: This exam was performed according to the departmental dose-optimization program which includes automated exposure control, adjustment of the mA and/or kV according to patient size and/or use of iterative reconstruction technique. COMPARISON:  Left femur x-ray 12/10/2023 FINDINGS: Bones/Joint/Cartilage There is an acute transverse fracture through the proximal 1/3 of the femoral diaphysis. The distal fracture fragment is rotated externally in displaced posterior medially to the proximal fracture  fragment 1.5 shaft width. There is 5 cm of overlap. There is no dislocation. No significant knee or hip joint effusion identified. Ligaments Suboptimally assessed by CT. Muscles and Tendons There is intramuscular edema surrounding the fracture site. No focal hematoma identified. Soft tissues Peripheral vascular calcifications are present. No focal hematoma or foreign body. Varicose veins are seen in the proximal calf. Sigmoid colon diverticula are present. IMPRESSION: Acute displaced and rotated fracture of the proximal femoral diaphysis. Electronically Signed   By: Darliss Cheney M.D.   On: 12/10/2023 21:11   DG Femur Min 2 Views Left Result Date: 12/10/2023 CLINICAL DATA:  Recent fall with left thigh deformity and foreshortening, initial encounter EXAM: LEFT FEMUR 2 VIEWS COMPARISON:  None Available. FINDINGS: Mid to distal diaphyseal fracture is noted in the left femur. Bony overlap is noted as previously described. No other focal abnormality is seen. IMPRESSION: Left femoral fracture with bony overlap. Electronically Signed   By: Alcide Clever M.D.   On: 12/10/2023 20:15   DG Hip Unilat W or Wo Pelvis 2-3 Views Left Result Date: 12/10/2023 CLINICAL DATA:  Recent fall with left hip pain, initial encounter EXAM: DG HIP (WITH OR WITHOUT PELVIS) 3V LEFT COMPARISON:  None Available. FINDINGS: Pelvic ring is intact. Left femoral head is well seated.  There is a mildly oblique fracture through the proximal to mid femoral shaft with proximally 4 cm of bony overlap at the fracture site. IMPRESSION: Left femoral diaphyseal fracture with significant bony overlap at the fracture site. Electronically Signed   By: Alcide Clever M.D.   On: 12/10/2023 20:14        Scheduled Meds:  docusate sodium  100 mg Oral BID   enoxaparin (LOVENOX) injection  70 mg Subcutaneous Q12H   gabapentin  100 mg Oral TID   lidocaine  1 patch Transdermal Q24H   multivitamin with minerals  1 tablet Oral Daily   pantoprazole  40 mg Oral  Daily   polyethylene glycol  17 g Oral Daily   polyvinyl alcohol  1 drop Both Eyes Daily   rosuvastatin  5 mg Oral BID   senna-docusate  1 tablet Oral QHS   warfarin  5 mg Oral ONCE-1600   Warfarin - Pharmacist Dosing Inpatient   Does not apply q1600   Continuous Infusions:   LOS: 2 days     Silvano Bilis, MD [facility]   If 7PM-7AM, please contact night-coverage [email or web address] Password [facility] 12/12/2023, 12:33 PM

## 2023-12-12 NOTE — Consult Note (Addendum)
Pharmacy Consult Note - Anticoagulation  Pharmacy Consult for warfarin Indication:  h/o portal vein thrombosis  PATIENT MEASUREMENTS: Height: 5' (152.4 cm) Weight: 68 kg (149 lb 14.6 oz) IBW/kg (Calculated) : 45.5 HEPARIN DW (KG): 60.2  VITAL SIGNS: Temp: 99 F (37.2 C) (01/30 0427) BP: 128/62 (01/30 0427) Pulse Rate: 76 (01/30 0427)  Recent Labs    12/10/23 2123 12/11/23 0408 12/12/23 0410  HGB 10.9*   < > 9.4*  HCT 30.9*   < > 27.7*  PLT 205   < > 208  APTT 37*  --   --   LABPROT 22.3*   < > 16.3*  INR 1.9*   < > 1.3*  CREATININE 1.05*   < > 1.05*   < > = values in this interval not displayed.    Estimated Creatinine Clearance: 35.5 mL/min (A) (by C-G formula based on SCr of 1.05 mg/dL (H)).  PAST MEDICAL HISTORY: Past Medical History:  Diagnosis Date   Arthritis    Atrophic vaginitis    Chronic kidney disease    Coronary artery disease    GERD (gastroesophageal reflux disease)    Hematuria    microscopic   Hepatitis    Hyperlipidemia    Hypertension    Kidney stone    Peripheral vascular disease (HCC)    Portal vein thrombosis     ASSESSMENT: 83 y.o. female with PMH including portal vein thrombosis is presenting with femoral fracture. Patient is on chronic anticoagulation with warfarin per chart review. Last dose unknown and patient currently in PACU. Her INR was slightly subtherapeutic at 1.9 upon presentation, and patient did receive vitamin K 2 mg IV x 1 prior to orthopedic surgery. Pharmacy has been consulted to initiate and manage heparin intravenous infusion.  Pertinent medications: Warfarin 2 mg Mon, Tues, Thurs, Fri Warfarin 1 mg Sun, Wed, Sat Total weekly warfarin = 11 mg  New drug-drug interactions: N/A  Goal(s) of therapy: INR 2 - 3 Monitor platelets by anticoagulation protocol: Yes   Baseline anticoagulation labs: Recent Labs    12/10/23 2123 12/11/23 0408 12/11/23 1006 12/12/23 0410  APTT 37*  --   --   --   INR 1.9* 1.7*  --   1.3*  HGB 10.9*  --  10.8* 9.4*  PLT 205  --  213 208   Date INR Warfarin Dose  1/30 1.3 5 mg  PLAN: INR remains subtherapeutic this morning (patient received 2 mg IV vitamin K on 1/29); CBC stable Per ortho, will bridge with lovenox until INR therapeutic Start lovenox 70 mg SQ every 12 hours Give warfarin 5 mg PO x1 this afternoon (2.5x home dose) Monitor daily INR while inpatient Continue to monitor for new drug-drug interactions Monitor CBC and signs/symptoms of bleeding  Thank you for involving pharmacy in this patient's care.   Rockwell Alexandria, PharmD Clinical Pharmacist 12/12/2023 7:13 AM

## 2023-12-13 DIAGNOSIS — S72302A Unspecified fracture of shaft of left femur, initial encounter for closed fracture: Secondary | ICD-10-CM | POA: Diagnosis not present

## 2023-12-13 LAB — BASIC METABOLIC PANEL
Anion gap: 11 (ref 5–15)
BUN: 13 mg/dL (ref 8–23)
CO2: 26 mmol/L (ref 22–32)
Calcium: 8.3 mg/dL — ABNORMAL LOW (ref 8.9–10.3)
Chloride: 99 mmol/L (ref 98–111)
Creatinine, Ser: 0.82 mg/dL (ref 0.44–1.00)
GFR, Estimated: >60 mL/min (ref >60)
Glucose, Bld: 115 mg/dL — ABNORMAL HIGH (ref 70–99)
Potassium: 4.5 mmol/L (ref 3.5–5.1)
Sodium: 136 mmol/L (ref 135–145)

## 2023-12-13 LAB — CBC
HCT: 30 % — ABNORMAL LOW (ref 36.0–46.0)
Hemoglobin: 10 g/dL — ABNORMAL LOW (ref 12.0–15.0)
MCH: 32.8 pg (ref 26.0–34.0)
MCHC: 33.3 g/dL (ref 30.0–36.0)
MCV: 98.4 fL (ref 80.0–100.0)
Platelets: 228 10*3/uL (ref 150–400)
RBC: 3.05 MIL/uL — ABNORMAL LOW (ref 3.87–5.11)
RDW: 14.1 % (ref 11.5–15.5)
WBC: 5.8 10*3/uL (ref 4.0–10.5)
nRBC: 0 % (ref 0.0–0.2)

## 2023-12-13 LAB — PROTIME-INR
INR: 1.7 — ABNORMAL HIGH (ref 0.8–1.2)
Prothrombin Time: 20.5 s — ABNORMAL HIGH (ref 11.4–15.2)

## 2023-12-13 MED ORDER — WARFARIN SODIUM 3 MG PO TABS
3.0000 mg | ORAL_TABLET | Freq: Once | ORAL | Status: AC
  Administered 2023-12-13: 3 mg via ORAL
  Filled 2023-12-13 (×2): qty 1

## 2023-12-13 MED ORDER — OXYCODONE-ACETAMINOPHEN 5-325 MG PO TABS: 1.0000 | ORAL_TABLET | ORAL | 0 refills | Status: AC | PRN

## 2023-12-13 MED ORDER — LACTULOSE 10 GM/15ML PO SOLN
30.0000 g | Freq: Once | ORAL | Status: AC
  Administered 2023-12-13: 30 g via ORAL
  Filled 2023-12-13: qty 60

## 2023-12-13 NOTE — Plan of Care (Signed)
   Problem: Education: Goal: Knowledge of General Education information will improve Description: Including pain rating scale, medication(s)/side effects and non-pharmacologic comfort measures Outcome: Progressing   Problem: Safety: Goal: Ability to remain free from injury will improve Outcome: Progressing   Problem: Skin Integrity: Goal: Risk for impaired skin integrity will decrease Outcome: Progressing

## 2023-12-13 NOTE — Progress Notes (Addendum)
   Subjective: 2 Days Post-Op Procedure(s) (LRB): INTRAMEDULLARY (IM) NAIL INTERTROCHANTERIC (Left) Patient reports pain as mild.   Patient is well, and has had no acute complaints or problems Denies any CP, SOB, ABD pain. We will continue therapy today. Ambulated 10 ft yesterday Plan is to to SNF.  Objective: Vital signs in last 24 hours: Temp:  [98.1 F (36.7 C)-98.4 F (36.9 C)] 98.4 F (36.9 C) (01/31 0743) Pulse Rate:  [65-79] 78 (01/31 0743) Resp:  [16-18] 16 (01/31 0743) BP: (105-135)/(61-68) 135/65 (01/31 0743) SpO2:  [95 %-100 %] 100 % (01/31 0743)  Intake/Output from previous day: 01/30 0701 - 01/31 0700 In: 480 [P.O.:480] Out: 1450 [Urine:1450] Intake/Output this shift: No intake/output data recorded.  Recent Labs    12/10/23 2123 12/11/23 1006 12/12/23 0410 12/13/23 0722  HGB 10.9* 10.8* 9.4* 10.0*   Recent Labs    12/12/23 0410 12/13/23 0722  WBC 6.2 5.8  RBC 2.84* 3.05*  HCT 27.7* 30.0*  PLT 208 228   Recent Labs    12/12/23 0410 12/13/23 0722  NA 134* 136  K 4.2 4.5  CL 100 99  CO2 26 26  BUN 13 13  CREATININE 1.05* 0.82  GLUCOSE 116* 115*  CALCIUM 8.4* 8.3*   Recent Labs    12/12/23 0410 12/13/23 0722  INR 1.3* 1.7*    EXAM General - Patient is Alert, Appropriate, and Oriented Extremity - Neurovascular intact Sensation intact distally Intact pulses distally Dorsiflexion/Plantar flexion intact Dressing - dressing C/D/I and no drainage Motor Function - intact, moving foot and toes well on exam.   Past Medical History:  Diagnosis Date   Arthritis    Atrophic vaginitis    Chronic kidney disease    Coronary artery disease    GERD (gastroesophageal reflux disease)    Hematuria    microscopic   Hepatitis    Hyperlipidemia    Hypertension    Kidney stone    Peripheral vascular disease (HCC)    Portal vein thrombosis     Assessment/Plan:   2 Days Post-Op Procedure(s) (LRB): INTRAMEDULLARY (IM) NAIL INTERTROCHANTERIC  (Left) Principal Problem:   Femur fracture, left (HCC) Active Problems:   Benign essential HTN   Deep vein thrombosis of portal vein   HLD (hyperlipidemia)   Chronic diastolic CHF (congestive heart failure) (HCC)   Chronic kidney disease, stage 3a (HCC)   Overweight (BMI 25.0-29.9)   Fall at home, initial encounter   Hyponatremia  Estimated body mass index is 29.28 kg/m as calculated from the following:   Height as of this encounter: 5' (1.524 m).   Weight as of this encounter: 68 kg. Advance diet Up with therapy Pain well controlled Labs and VSS Work on BM INR 1.7, bridge with lovenox CM to assist with discharge to SNF  DVT Prophylaxis - Lovenox and Coumadin TEDS SCDs Weight-Bearing as tolerated to left leg   T. Cranston Neighbor, PA-C [facility] 12/13/2023, 8:35 AM

## 2023-12-13 NOTE — Plan of Care (Signed)

## 2023-12-13 NOTE — Progress Notes (Signed)
PROGRESS NOTE    Mary Buckley  ZOX:[id number] DOB: 1940-11-15 DOA: 12/10/2023 PCP: Marguarite Arbour, MD     Brief Narrative:   From admission h and p  Mary Buckley is a 83 y.o. 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   Patient states that she accidentally fell when she was walking at home.  Denies loss of consciousness.  No head or neck injury.  She developed pain in left upper leg and hip area, which is constant, sharp, severe, nonradiating, aggravated by movement.  No chest pain, cough, SOB.  No nausea, vomiting, diarrhea or abdominal pain.  No symptoms of UTI.  Assessment & Plan:   Principal Problem:   Femur fracture, left (HCC) Active Problems:   Fall at home, initial encounter   Deep vein thrombosis of portal vein   Benign essential HTN   HLD (hyperlipidemia)   Chronic diastolic CHF (congestive heart failure) (HCC)   Chronic kidney disease, stage 3a (HCC)   Overweight (BMI 25.0-29.9)   Hyponatremia  # Acute left femoral shaft fracture After fall, s/p operative repair with nail on 1/29 with Dr. Audelia Acton. Hgb stable post-op - PT/OT, pain control, bowel regimen    # Constipation No bm since surgery - continue senna/miralax, add lactulose today  # Orthostasis yesterday with OT - push fluids - holding home metop/imdur  # Portal vein thrombosis On coumadin at home, reversed prior to surgery. Inr 1.7 today - now on lovenox bridge to coumadin  # Hypoxia Resolved, cxr clear - monitor  # Osteoporosis On prolia outpt, follows w/ endo - will need endo f/u  # HTN Orthostatic as above - hold home imdur, bisoprolol  # Hyponatremia resolved - monitor  # Chronic pain - home gabapentin   DVT prophylaxis: lovenox/coumadin Code Status: full Family Communication: daughter  updated @ bedside 1/31  Level of care: Telemetry Medical Status is: Inpatient Remains inpatient appropriate because: pending SNF placement    Consultants:  orthopedics  Procedures: Operative repair  Antimicrobials:  Pre-operative    Subjective: Ongoing left leg pain mainly in thigh, stable, tolerating diet, little light headed when sat up to eat today  Objective: Vitals:   12/12/23 1544 12/12/23 1922 12/12/23 2255 12/13/23 0743  BP: 120/68 124/61 115/68 135/65  Pulse: 65 77 74 78  Resp: 16 18 18 16   Temp: 98.1 F (36.7 C) 98.3 F (36.8 C) 98.4 F (36.9 C) 98.4 F (36.9 C)  TempSrc:  Oral Oral   SpO2: 95% 95% 96% 100%  Weight:      Height:        Intake/Output Summary (Last 24 hours) at 12/13/2023 1025 Last data filed at 12/13/2023 0500 Gross per 24 hour  Intake 240 ml  Output 1450 ml  Net -1210 ml   Filed Weights   12/10/23 1931 12/11/23 1359  Weight: 68 kg 68 kg    Examination:  General exam: Appears calm and comfortable  Respiratory system: Clear to auscultation. Respiratory effort normal. Cardiovascular system: S1 & S2 heard, RRR.  Gastrointestinal system: Abdomen is nondistended, soft and nontender  Central nervous system: Alert and oriented. No focal neurological deficits. Extremities: warm, distal sensation intact, 2 bandages left upper extremity c/d/i Skin: No rashes, lesions or ulcers Psychiatry: Judgement and insight appear normal. Mood & affect appropriate.     Data Reviewed: I have personally reviewed  following labs and imaging studies  CBC: Recent Labs  Lab 12/10/23 2123 12/11/23 1006 12/12/23 0410 12/13/23 0722  WBC 7.6 5.2 6.2 5.8  NEUTROABS 6.5  --   --   --   HGB 10.9* 10.8* 9.4* 10.0*  HCT 30.9* 31.3* 27.7* 30.0*  MCV 95.7 96.9 97.5 98.4  PLT 205 213 208 228   Basic Metabolic Panel: Recent Labs  Lab 12/10/23 2123 12/11/23 1006 12/12/23 0410 12/13/23 0722  NA 128* 133* 134* 136  K 4.3 4.2 4.2 4.5  CL 88* 101 100 99   CO2 23 23 26 26   GLUCOSE 127* 106* 116* 115*  BUN 16 11 13 13   CREATININE 1.05* 0.82 1.05* 0.82  CALCIUM 9.4 8.6* 8.4* 8.3*   GFR: Estimated Creatinine Clearance: 45.5 mL/min (by C-G formula based on SCr of 0.82 mg/dL). Liver Function Tests: No results for input(s): "AST", "ALT", "ALKPHOS", "BILITOT", "PROT", "ALBUMIN" in the last 168 hours. No results for input(s): "LIPASE", "AMYLASE" in the last 168 hours. No results for input(s): "AMMONIA" in the last 168 hours. Coagulation Profile: Recent Labs  Lab 12/10/23 2123 12/11/23 0408 12/12/23 0410 12/13/23 0722  INR 1.9* 1.7* 1.3* 1.7*   Cardiac Enzymes: No results for input(s): "CKTOTAL", "CKMB", "CKMBINDEX", "TROPONINI" in the last 168 hours. BNP (last 3 results) No results for input(s): "PROBNP" in the last 8760 hours. HbA1C: No results for input(s): "HGBA1C" in the last 72 hours. CBG: No results for input(s): "GLUCAP" in the last 168 hours. Lipid Profile: No results for input(s): "CHOL", "HDL", "LDLCALC", "TRIG", "CHOLHDL", "LDLDIRECT" in the last 72 hours. Thyroid Function Tests: No results for input(s): "TSH", "T4TOTAL", "FREET4", "T3FREE", "THYROIDAB" in the last 72 hours. Anemia Panel: No results for input(s): "VITAMINB12", "FOLATE", "FERRITIN", "TIBC", "IRON", "RETICCTPCT" in the last 72 hours. Urine analysis:    Component Value Date/Time   COLORURINE STRAW (A) 03/02/2023 0726   APPEARANCEUR CLEAR (A) 03/02/2023 0726   APPEARANCEUR Clear 01/01/2023 1333   LABSPEC 1.015 03/02/2023 0726   LABSPEC 1.003 10/01/2014 0607   PHURINE 6.0 03/02/2023 0726   GLUCOSEU NEGATIVE 03/02/2023 0726   GLUCOSEU Negative 10/01/2014 0607   HGBUR MODERATE (A) 03/02/2023 0726   BILIRUBINUR NEGATIVE 03/02/2023 0726   BILIRUBINUR Negative 01/01/2023 1333   BILIRUBINUR Negative 10/01/2014 0607   KETONESUR NEGATIVE 03/02/2023 0726   PROTEINUR NEGATIVE 03/02/2023 0726   NITRITE NEGATIVE 03/02/2023 0726   LEUKOCYTESUR NEGATIVE  03/02/2023 0726   LEUKOCYTESUR Trace 10/01/2014 0607   Sepsis Labs: @LABRCNTIP (procalcitonin:4,lacticidven:4)  )No results found for this or any previous visit (from the past 240 hours).       Radiology Studies: DG HIP UNILAT WITH PELVIS 2-3 VIEWS LEFT Result Date: 12/11/2023 CLINICAL DATA:  Elective surgery.  Intramedullary nail. EXAM: DG HIP (WITH OR WITHOUT PELVIS) 2-3V LEFT COMPARISON:  Left femur radiographs 12/10/2023 FINDINGS: Images were performed intraoperatively without the presence of a radiologist. New long intramedullary nail fixation of the previously seen complete transverse fracture of the proximal femoral diaphysis. Improved, now normal alignment. No hardware complication is seen. Total fluoroscopy images: 8 Total fluoroscopy time: 81 seconds Total dose: Radiation Exposure Index (as provided by the fluoroscopic device): 29.89 mGy air Kerma Please see intraoperative findings for further detail. IMPRESSION: Intraoperative fluoroscopy for proximal femoral diaphysis fracture fixation. Electronically Signed   By: Neita Garnet M.D.   On: 12/11/2023 18:41   DG Chest Port 1 View Result Date: 12/11/2023 CLINICAL DATA:  Hypoxia.  Preop. EXAM: PORTABLE CHEST 1 VIEW COMPARISON:  CT 03/02/2023 FINDINGS:  The heart is normal in size. Aortic atherosclerosis and tortuosity. Otherwise normal mediastinal contours. Minor left lung base atelectasis. No pulmonary edema, pleural effusion, or pneumothorax. IMPRESSION: Minor left lung base atelectasis. Electronically Signed   By: Narda Rutherford M.D.   On: 12/11/2023 17:44   DG C-Arm 1-60 Min-No Report Result Date: 12/11/2023 Fluoroscopy was utilized by the requesting physician.  No radiographic interpretation.   DG C-Arm 1-60 Min-No Report Result Date: 12/11/2023 Fluoroscopy was utilized by the requesting physician.  No radiographic interpretation.        Scheduled Meds:  docusate sodium  100 mg Oral BID   enoxaparin (LOVENOX) injection   70 mg Subcutaneous Q12H   gabapentin  100 mg Oral TID   lidocaine  1 patch Transdermal Q24H   multivitamin with minerals  1 tablet Oral Daily   pantoprazole  40 mg Oral Daily   polyethylene glycol  34 g Oral Daily   polyvinyl alcohol  1 drop Both Eyes Daily   rosuvastatin  5 mg Oral BID   senna-docusate  1 tablet Oral QHS   warfarin  3 mg Oral ONCE-1600   Warfarin - Pharmacist Dosing Inpatient   Does not apply q1600   Continuous Infusions:   LOS: 3 days     Silvano Bilis, MD [facility]   If 7PM-7AM, please contact night-coverage [email or web address] Password [facility] 12/13/2023, 10:25 AM

## 2023-12-13 NOTE — Care Management Important Message (Signed)
Important Message  Patient Details  Name: Mary Buckley MRN: [id number] Date of Birth: Jan 09, 1941   Important Message Given:  Yes - Medicare IM     Cristela Blue, CMA 12/13/2023, 9:37 AM

## 2023-12-13 NOTE — Progress Notes (Signed)
Physical Therapy Treatment Patient Details Name: Esbeidy Mclaine MRN: [id number] DOB: 1941-08-12 Today's Date: 12/13/2023   History of Present Illness Evangelene Vora is a 83 y.o. 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 Found to have a left femoral shaft fracture now s/p IM nail 12/11/23.    PT Comments  Increased time spent with pt and daughter to provide safe treatment and mobility. Pt found to experience Orthostatic Hypotension and significantly symptomatic affecting sitting and standing tolerance. Mod /MaxA for bed mobility and transfers with support of RW. Pt transferred bed>BSC>chair during session. Unable to progress gait distance. Pt required fully reclined chair position after finishing on BSC x 3 minute to stabilize BP and reduce dizziness prior to sitting up right for lunch. MD and nursing notified. Will continue with caution and assess vitals as needed.  Supine: 135/68, HR 78  Sitting 119/62, HR 90  Standing 75/54, HR 103  Sitting 100/68, HR 88    If plan is discharge home, recommend the following: A lot of help with walking and/or transfers;A lot of help with bathing/dressing/bathroom;Assist for transportation;Help with stairs or ramp for entrance   Can travel by private vehicle     No  Equipment Recommendations  Other (comment) (TBD)    Recommendations for Other Services       Precautions / Restrictions Precautions Precautions: Fall Restrictions Weight Bearing Restrictions Per Provider Order: Yes LLE Weight Bearing Per Provider Order: Weight bearing as tolerated Other Position/Activity Restrictions: Orthostatic Hypotension     Mobility  Bed Mobility Overal bed mobility: Needs Assistance Bed Mobility: Sit to Supine     Supine to sit: Min assist Sit to supine: Max assist    General bed mobility comments: needs assist for B LE management and repositioning in bed. Utilized bed functions to assist with scooting in bed    Transfers Overall transfer level: Needs assistance Equipment used: Rolling walker (2 wheels) Transfers: Sit to/from Stand Sit to Stand: Mod assist Stand pivot transfers: Min assist         General transfer comment: Cues for proper sequencing, does better moving towards R side    Ambulation/Gait Ambulation/Gait assistance: Mod assist Gait Distance (Feet): 3 Feet Assistive device: Rolling walker (2 wheels) Gait Pattern/deviations: Step-to pattern, Antalgic Gait velocity: decr     General Gait Details: Modified gait distance due to significant dizziness with positional changes. +Orthostatic vitals   Stairs             Wheelchair Mobility     Tilt Bed    Modified Rankin (Stroke Patients Only)       Balance Overall balance assessment: Needs assistance Sitting-balance support: No upper extremity supported, Feet supported Sitting balance-Leahy Scale: Good     Standing balance support: Bilateral upper extremity supported, During functional activity, Reliant on assistive device for balance Standing balance-Leahy Scale: Fair Standing balance comment:  (High risk for fall if BP drops in standing)                            Cognition Arousal: Alert Behavior During Therapy: WFL for tasks assessed/performed Overall Cognitive Status: Within Functional Limits for tasks assessed  General Comments: pleasant and agreeable to session        Exercises Other Exercises Other Exercises: Edu on role of PT, current goals, and benefits of post acute PT Other Exercises:  (Hip fracture HEP issued)    General Comments General comments (skin integrity, edema, etc.): Pt and daughter educated at length regarding safe mobility with Orthostatic Hypotension.      Pertinent  Vitals/Pain Pain Assessment Pain Assessment: 0-10 Pain Score: 8  Pain Location: L thigh Pain Descriptors / Indicators: Discomfort, Grimacing, Operative site guarding Pain Intervention(s): Patient requesting pain meds-RN notified, Premedicated before session    Home Living                          Prior Function            PT Goals (current goals can now be found in the care plan section) Acute Rehab PT Goals Patient Stated Goal: to go home    Frequency    7X/week      PT Plan      Co-evaluation              AM-PAC PT "6 Clicks" Mobility   Outcome Measure  Help needed turning from your back to your side while in a flat bed without using bedrails?: A Lot Help needed moving from lying on your back to sitting on the side of a flat bed without using bedrails?: A Lot Help needed moving to and from a bed to a chair (including a wheelchair)?: A Lot Help needed standing up from a chair using your arms (e.g., wheelchair or bedside chair)?: A Little Help needed to walk in hospital room?: A Lot Help needed climbing 3-5 steps with a railing? : Total 6 Click Score: 12    End of Session Equipment Utilized During Treatment: Gait belt Activity Tolerance: Treatment limited secondary to medical complications (Comment) (Orthostatic) Patient left: in chair;with call bell/phone within reach;with chair alarm set;with family/visitor present Nurse Communication: Mobility status;Other (comment) (Orthostatic Vitals) PT Visit Diagnosis: Muscle weakness (generalized) (M62.81);History of falling (Z91.81);Difficulty in walking, not elsewhere classified (R26.2);Pain Pain - Right/Left: Left Pain - part of body: Hip     Time: 1140-1235 PT Time Calculation (min) (ACUTE ONLY): 55 min  Charges:    $Gait Training: 8-22 mins $Therapeutic Exercise: 8-22 mins $Therapeutic Activity: 23-37 mins PT General Charges $$ ACUTE PT VISIT: 1 Visit                    Zadie Cleverly,  PTA  Jannet Askew 12/13/2023, 3:38 PM

## 2023-12-13 NOTE — Consult Note (Signed)
Pharmacy Consult Note - Anticoagulation  Pharmacy Consult for warfarin Indication:  h/o portal vein thrombosis  PATIENT MEASUREMENTS: Height: 5' (152.4 cm) Weight: 68 kg (149 lb 14.6 oz) IBW/kg (Calculated) : 45.5 HEPARIN DW (KG): 60.2  VITAL SIGNS: Temp: 98.4 F (36.9 C) (01/31 0743) Temp Source: Oral (01/30 2255) BP: 135/65 (01/31 0743) Pulse Rate: 78 (01/31 0743)  Recent Labs    12/10/23 2123 12/11/23 0408 12/13/23 0722  HGB 10.9*   < > 10.0*  HCT 30.9*   < > 30.0*  PLT 205   < > 228  APTT 37*  --   --   LABPROT 22.3*   < > 20.5*  INR 1.9*   < > 1.7*  CREATININE 1.05*   < > 0.82   < > = values in this interval not displayed.    Estimated Creatinine Clearance: 45.5 mL/min (by C-G formula based on SCr of 0.82 mg/dL).  PAST MEDICAL HISTORY: Past Medical History:  Diagnosis Date   Arthritis    Atrophic vaginitis    Chronic kidney disease    Coronary artery disease    GERD (gastroesophageal reflux disease)    Hematuria    microscopic   Hepatitis    Hyperlipidemia    Hypertension    Kidney stone    Peripheral vascular disease (HCC)    Portal vein thrombosis     ASSESSMENT: 83 y.o. female with PMH including portal vein thrombosis is presenting with femoral fracture. Patient is on chronic anticoagulation with warfarin per chart review. Last dose unknown and patient currently in PACU. Her INR was slightly subtherapeutic at 1.9 upon presentation, and patient did receive vitamin K 2 mg IV x 1 prior to orthopedic surgery. Pharmacy has been consulted to initiate and manage heparin intravenous infusion.  Pertinent medications: Warfarin 2 mg Mon, Tues, Thurs, Fri Warfarin 1 mg Sun, Wed, Sat Total weekly warfarin = 11 mg  New drug-drug interactions: N/A  Goal(s) of therapy: INR 2 - 3 Monitor platelets by anticoagulation protocol: Yes   Baseline anticoagulation labs: Recent Labs    12/10/23 2123 12/11/23 0408 12/11/23 1006 12/12/23 0410 12/13/23 0722  APTT  37*  --   --   --   --   INR 1.9* 1.7*  --  1.3* 1.7*  HGB 10.9*  --  10.8* 9.4* 10.0*  PLT 205  --  213 208 228   Date INR Warfarin Dose  1/29 1.7 3 mg 1/30 1.3 5 mg 1/31 1.7 3 mg  PLAN: INR remains subtherapeutic this morning (patient received 2 mg IV vitamin K on 1/29); Hgb 10, plt 228 Per ortho, will bridge with lovenox until INR therapeutic Start lovenox 70 mg SQ every 12 hours Give warfarin 3 mg PO x1 this afternoon Monitor daily INR while inpatient Continue to monitor for new drug-drug interactions Monitor CBC and signs/symptoms of bleeding  Thank you for involving pharmacy in this patient's care.   Rockwell Alexandria, PharmD Clinical Pharmacist 12/13/2023 8:30 AM

## 2023-12-14 DIAGNOSIS — S72322A Displaced transverse fracture of shaft of left femur, initial encounter for closed fracture: Secondary | ICD-10-CM | POA: Diagnosis not present

## 2023-12-14 DIAGNOSIS — W19XXXA Unspecified fall, initial encounter: Secondary | ICD-10-CM

## 2023-12-14 DIAGNOSIS — E871 Hypo-osmolality and hyponatremia: Secondary | ICD-10-CM

## 2023-12-14 DIAGNOSIS — N1831 Chronic kidney disease, stage 3a: Secondary | ICD-10-CM | POA: Diagnosis not present

## 2023-12-14 LAB — CBC
HCT: 28 % — ABNORMAL LOW (ref 36.0–46.0)
Hemoglobin: 9.6 g/dL — ABNORMAL LOW (ref 12.0–15.0)
MCH: 33.3 pg (ref 26.0–34.0)
MCHC: 34.3 g/dL (ref 30.0–36.0)
MCV: 97.2 fL (ref 80.0–100.0)
Platelets: 248 10*3/uL (ref 150–400)
RBC: 2.88 MIL/uL — ABNORMAL LOW (ref 3.87–5.11)
RDW: 14.2 % (ref 11.5–15.5)
WBC: 6.5 10*3/uL (ref 4.0–10.5)
nRBC: 0 % (ref 0.0–0.2)

## 2023-12-14 LAB — BASIC METABOLIC PANEL
Anion gap: 8 (ref 5–15)
BUN: 14 mg/dL (ref 8–23)
CO2: 24 mmol/L (ref 22–32)
Calcium: 8 mg/dL — ABNORMAL LOW (ref 8.9–10.3)
Chloride: 97 mmol/L — ABNORMAL LOW (ref 98–111)
Creatinine, Ser: 0.72 mg/dL (ref 0.44–1.00)
GFR, Estimated: >60 mL/min (ref >60)
Glucose, Bld: 113 mg/dL — ABNORMAL HIGH (ref 70–99)
Potassium: 4.5 mmol/L (ref 3.5–5.1)
Sodium: 129 mmol/L — ABNORMAL LOW (ref 135–145)

## 2023-12-14 LAB — PROTIME-INR
INR: 2.1 — ABNORMAL HIGH (ref 0.8–1.2)
Prothrombin Time: 24 s — ABNORMAL HIGH (ref 11.4–15.2)

## 2023-12-14 MED ORDER — WARFARIN SODIUM 1 MG PO TABS
1.0000 mg | ORAL_TABLET | Freq: Once | ORAL | Status: AC
  Administered 2023-12-14: 1 mg via ORAL
  Filled 2023-12-14: qty 1

## 2023-12-14 MED ORDER — OXYCODONE-ACETAMINOPHEN 5-325 MG PO TABS
1.0000 | ORAL_TABLET | ORAL | Status: DC | PRN
  Administered 2023-12-16: 1 via ORAL
  Filled 2023-12-14: qty 1

## 2023-12-14 MED ORDER — OMEGA-3-ACID ETHYL ESTERS 1 G PO CAPS
1.0000 g | ORAL_CAPSULE | Freq: Two times a day (BID) | ORAL | Status: DC
Start: 2023-12-14 — End: 2023-12-18
  Administered 2023-12-14 – 2023-12-18 (×9): 1 g via ORAL
  Filled 2023-12-14 (×9): qty 1

## 2023-12-14 MED ORDER — VITAMIN D 25 MCG (1000 UNIT) PO TABS
5000.0000 [IU] | ORAL_TABLET | Freq: Every day | ORAL | Status: DC
  Administered 2023-12-14 – 2023-12-18 (×5): 5000 [IU] via ORAL
  Filled 2023-12-14 (×5): qty 5

## 2023-12-14 MED ORDER — VITAMIN B-12 100 MCG PO TABS
100.0000 ug | ORAL_TABLET | Freq: Every day | ORAL | Status: DC
  Administered 2023-12-14 – 2023-12-18 (×5): 100 ug via ORAL
  Filled 2023-12-14 (×5): qty 1

## 2023-12-14 MED ORDER — SODIUM CHLORIDE 0.9 % IV BOLUS
500.0000 mL | Freq: Once | INTRAVENOUS | Status: AC
  Administered 2023-12-14: 500 mL via INTRAVENOUS

## 2023-12-14 NOTE — TOC Progression Note (Signed)
Transition of Care [facility]) - Progression Note    Patient Details  Name: Mary Buckley MRN: [id number] Date of Birth: 1941-09-21  [facility] CM/SW Contact  Hetty Ely, RN Phone Number: 12/14/2023, 9:35 AM  Clinical Narrative:  Discussed SNF recommendation with patient who is receptive, preferences [facility] and [facility]. FL[facility] completed search process started.          Expected Discharge Plan and Services                                               Social Determinants of Health (SDOH) Interventions SDOH Screenings   Food Insecurity: No Food Insecurity (12/11/2023)  Housing: Patient Declined (12/11/2023)  Transportation Needs: Patient Declined (12/11/2023)  Utilities: Patient Declined (12/11/2023)  Social Connections: Unknown (12/11/2023)  Tobacco Use: Low Risk  (12/11/2023)    Readmission Risk Interventions     No data to display

## 2023-12-14 NOTE — Discharge Instructions (Signed)
INSTRUCTIONS AFTER Surgery  Remove items at home which could result in a fall. This includes throw rugs or furniture in walking pathways ICE to the affected joint every three hours while awake for 30 minutes at a time, for at least the first 3-5 days, and then as needed for pain and swelling.  Continue to use ice for pain and swelling. You may notice swelling that will progress down to the foot and ankle.  This is normal after surgery.  Elevate your leg when you are not up walking on it.   Continue to use the breathing machine you got in the hospital (incentive spirometer) which will help keep your temperature down.  It is common for your temperature to cycle up and down following surgery, especially at night when you are not up moving around and exerting yourself.  The breathing machine keeps your lungs expanded and your temperature down.   DIET:  As you were doing prior to hospitalization, we recommend a well-balanced diet.  DRESSING / WOUND CARE / SHOWERING  Dressing changes needed.  No showering.  Staples will be removed in 2 weeks.  ACTIVITY  Increase activity slowly as tolerated, but follow the weight bearing instructions below.   No driving for 6 weeks or until further direction given by your physician.  You cannot drive while taking narcotics.  No lifting or carrying greater than 10 lbs. until further directed by your surgeon. Avoid periods of inactivity such as sitting longer than an hour when not asleep. This helps prevent blood clots.  You may return to work once you are authorized by your doctor.     WEIGHT BEARING  Weightbearing as tolerated on the left   EXERCISES Gait training and ambulation training  CONSTIPATION  Constipation is defined medically as fewer than three stools per week and severe constipation as less than one stool per week.  Even if you have a regular bowel pattern at home, your normal regimen is likely to be disrupted due to multiple reasons following  surgery.  Combination of anesthesia, postoperative narcotics, change in appetite and fluid intake all can affect your bowels.   YOU MUST use at least one of the following options; they are listed in order of increasing strength to get the job done.  They are all available over the counter, and you may need to use some, POSSIBLY even all of these options:    Drink plenty of fluids (prune juice may be helpful) and high fiber foods Colace 100 mg by mouth twice a day  Senokot for constipation as directed and as needed Dulcolax (bisacodyl), take with full glass of water  Miralax (polyethylene glycol) once or twice a day as needed.  If you have tried all these things and are unable to have a bowel movement in the first 3-4 days after surgery call either your surgeon or your primary doctor.    If you experience loose stools or diarrhea, hold the medications until you stool forms back up.  If your symptoms do not get better within 1 week or if they get worse, check with your doctor.  If you experience "the worst abdominal pain ever" or develop nausea or vomiting, please contact the office immediately for further recommendations for treatment.   ITCHING:  If you experience itching with your medications, try taking only a single pain pill, or even half a pain pill at a time.  You can also use Benadryl over the counter for itching or also to help with  sleep.   TED HOSE STOCKINGS:  Use stockings on both legs until for at least 2 weeks or as directed by physician office. They may be removed at night for sleeping.  MEDICATIONS:  See your medication summary on the "After Visit Summary" that nursing will review with you.  You may have some home medications which will be placed on hold until you complete the course of blood thinner medication.  It is important for you to complete the blood thinner medication as prescribed.  PRECAUTIONS:  If you experience chest pain or shortness of breath - call 911 immediately  for transfer to the hospital emergency department.   If you develop a fever greater that 101 F, purulent drainage from wound, increased redness or drainage from wound, foul odor from the wound/dressing, or calf pain - CONTACT YOUR SURGEON.                                                   FOLLOW-UP APPOINTMENTS:  If you do not already have a post-op appointment, please call the office for an appointment to be seen by your surgeon.  Guidelines for how soon to be seen are listed in your "After Visit Summary", but are typically between 1-4 weeks after surgery.  OTHER INSTRUCTIONS:     MAKE SURE YOU:  Understand these instructions.  Get help right away if you are not doing well or get worse.    Thank you for letting us be a part of your medical care team.  It is a privilege we respect greatly.  We hope these instructions will help you stay on track for a fast and full recovery!

## 2023-12-14 NOTE — Consult Note (Signed)
Pharmacy Consult Note - Anticoagulation  Pharmacy Consult for warfarin Indication:  h/o portal vein thrombosis  PATIENT MEASUREMENTS: Height: 5' (152.4 cm) Weight: 68 kg (149 lb 14.6 oz) IBW/kg (Calculated) : 45.5 HEPARIN DW (KG): 60.2  VITAL SIGNS: Temp: 98.2 F (36.8 C) (02/01 0751) Temp Source: Oral (02/01 0751) BP: 123/56 (02/01 0751) Pulse Rate: 80 (02/01 0751)  Recent Labs    12/14/23 0439  HGB 9.6*  HCT 28.0*  PLT 248  LABPROT 24.0*  INR 2.1*  CREATININE 0.72    Estimated Creatinine Clearance: 46.6 mL/min (by C-G formula based on SCr of 0.72 mg/dL).  PAST MEDICAL HISTORY: Past Medical History:  Diagnosis Date   Arthritis    Atrophic vaginitis    Chronic kidney disease    Coronary artery disease    GERD (gastroesophageal reflux disease)    Hematuria    microscopic   Hepatitis    Hyperlipidemia    Hypertension    Kidney stone    Peripheral vascular disease (HCC)    Portal vein thrombosis    ASSESSMENT: 83 y.o. female with PMH including portal vein thrombosis is presenting with femoral fracture. Patient is on chronic anticoagulation with warfarin per chart review. Last dose unknown and patient currently in PACU. Her INR was slightly subtherapeutic at 1.9 upon presentation, and patient did receive vitamin K 2 mg IV x 1 prior to orthopedic surgery. Pharmacy has been consulted to initiate and manage heparin intravenous infusion.  Pertinent medications: Warfarin 2 mg Mon, Tues, Thurs, Fri Warfarin 1 mg Sun, Wed, Sat Total weekly warfarin = 11 mg  New drug-drug interactions: N/A  Goal(s) of therapy: INR 2 - 3 Monitor platelets by anticoagulation protocol: Yes   Baseline anticoagulation labs: Recent Labs    12/12/23 0410 12/13/23 0722 12/14/23 0439  INR 1.3* 1.7* 2.1*  HGB 9.4* 10.0* 9.6*  PLT 208 228 248   Date INR Warfarin Dose  1/29 1.7 3 mg 1/30 1.3 5 mg 1/31 1.7 3 mg 2/1 2.1 1 mg  PLAN: INR therapeutic today at 2.1 today. Will give  home dose of 1 mg tonight. Stop Lovenox Per ortho, will bridge with lovenox until INR therapeutic Monitor daily INR while inpatient Continue to monitor for new drug-drug interactions Monitor CBC and signs/symptoms of bleeding  Thank you for involving pharmacy in this patient's care.   Tressie Ellis 12/14/2023 12:31 PM

## 2023-12-14 NOTE — Progress Notes (Signed)
Physical Therapy Treatment Patient Details Name: Mary Buckley MRN: [id number] DOB: November 03, 1941 Today's Date: 12/14/2023   History of Present Illness Mary Buckley is a 83 y.o. 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 Found to have a left femoral shaft fracture now s/p IM nail 12/11/23.    PT Comments  Pt was long sitting in bed upon arrival. She is extremely pleasant and cooperative. Supportive son present throughout. Session greatly limited by pt's hypotensive response to standing. Pt becomes clammy and endorses dizziness with standing.  BP at rest:117/64(80) BP in sitting: 112/62 BP 2 min sitting EOB: 1110/65(72) BP in standing: 68/48 but pt unable to remain standing long enough to recheck due to being so symptomatic in standing. MD aware and ordered bolus. Author will return later this date as requested and continue to follow per current POC.     If plan is discharge home, recommend the following: A lot of help with walking and/or transfers;A lot of help with bathing/dressing/bathroom;Assistance with cooking/housework;Assist for transportation;Help with stairs or ramp for entrance;Supervision due to cognitive status     Equipment Recommendations  Other (comment) (Defer to next level of care)       Precautions / Restrictions Precautions Precautions: Fall Restrictions Weight Bearing Restrictions Per Provider Order: Yes LLE Weight Bearing Per Provider Order: Weight bearing as tolerated Other Position/Activity Restrictions: Orthostatic Hypotension     Mobility  Bed Mobility Overal bed mobility: Needs Assistance Bed Mobility: Supine to Sit, Sit to Supine  Supine to sit: Mod assist Sit to supine: Max assist General bed mobility comments: Pt continues to require assistance to safely exit bed and then  to returnt o bed after standing    Transfers Overall transfer level: Needs assistance Equipment used: Rolling walker (2 wheels) Transfers: Sit to/from Stand Sit to Stand: Min assist, From elevated surface  General transfer comment: pt unable to tolerate standing for more than ~ 10 sec prior to becoming symptomatic. profusely sweating + extreme drop in BP. BP dropped to 68/48. MD made aware. Bolus ordered    Ambulation/Gait  General Gait Details: could not progress due to dizziness/hyptension concerns   Balance Overall balance assessment: Needs assistance Sitting-balance support: No upper extremity supported, Feet supported Sitting balance-Leahy Scale: Good     Standing balance support: Bilateral upper extremity supported, During functional activity, Reliant on assistive device for balance Standing balance-Leahy Scale: Fair Standing balance comment: limited standing due to Hypotension concerns     Cognition Arousal: Alert Behavior During Therapy: WFL for tasks assessed/performed Overall Cognitive Status: Within Functional Limits for tasks assessed  General Comments: pleasant and agreeable to session however session greatly limited by BP           General Comments General comments (skin integrity, edema, etc.): pt was encouraged to increase fluid intake and encouraged to perform HEP. Author will return after bolus to attempt oOB activity again      Pertinent Vitals/Pain Pain Assessment Pain Assessment: 0-10 Pain Score: 4  Pain Location: L thigh Pain Descriptors / Indicators: Discomfort, Grimacing, Operative site guarding Pain Intervention(s): Limited activity within patient's tolerance, Monitored during session, Premedicated before session, Repositioned     PT Goals (current goals can now be found in the care plan section) Acute Rehab PT Goals Patient Stated Goal: to go home Progress towards PT goals: Not progressing toward goals - comment (limited  progress made due to  orthostatic hypotension concerns)    Frequency    7X/week       AM-PAC PT "6 Clicks" Mobility   Outcome Measure  Help needed turning from your back to your side while in a flat bed without using bedrails?: A Lot Help needed moving from lying on your back to sitting on the side of a flat bed without using bedrails?: A Lot Help needed moving to and from a bed to a chair (including a wheelchair)?: A Lot Help needed standing up from a chair using your arms (e.g., wheelchair or bedside chair)?: A Little Help needed to walk in hospital room?: A Lot Help needed climbing 3-5 steps with a railing? : A Lot 6 Click Score: 13    End of Session   Activity Tolerance: Treatment limited secondary to medical complications (Comment) (limited by orthostatic hypotensive response to standing) Patient left: in bed;with call bell/phone within reach;with family/visitor present (RN techs in room + son present) Nurse Communication: Mobility status PT Visit Diagnosis: Muscle weakness (generalized) (M62.81);History of falling (Z91.81);Difficulty in walking, not elsewhere classified (R26.2);Pain Pain - Right/Left: Left Pain - part of body: Hip     Time: 1191-4782 PT Time Calculation (min) (ACUTE ONLY): 19 min  Charges:    $Therapeutic Activity: 8-22 mins PT General Charges $$ ACUTE PT VISIT: 1 Visit                    Jetta Lout PTA 12/14/23, 10:47 AM

## 2023-12-14 NOTE — [place] FL2 (Signed)
[place] MEDICAID FL2 LEVEL OF CARE FORM     IDENTIFICATION  Patient Name: Mary Buckley Birthdate: May 22, 1941 Sex: female Admission Date (Current Location): 12/10/2023  [facility] and [place] Number:  Chiropodist and Address:  [facility], [address] [place], [place] [address]      Provider Number: [id number]  Attending Physician Name and Address:  Enedina Finner, MD  Relative Name and Phone Number:  Timoteo Gaul- Daughter [phone]    Current Level of Care: Hospital Recommended Level of Care: Skilled Nursing Facility Prior Approval Number:    Date Approved/Denied:   PASRR Number: [id number]  Discharge Plan: SNF    Current Diagnoses: Patient Active Problem List   Diagnosis Date Noted   Fall at home, initial encounter 12/11/2023   Hyponatremia 12/11/2023   Femur fracture, left (HCC) 12/10/2023   HLD (hyperlipidemia) 12/10/2023   Chronic diastolic CHF (congestive heart failure) (HCC) 12/10/2023   Chronic kidney disease, stage 3a (HCC) 12/10/2023   Overweight (BMI 25.0-29.9) 12/10/2023   Degeneration of intervertebral disc of lumbosacral region 02/23/2016   Allergy to environmental factors 02/23/2016   Acid reflux 02/23/2016   Combined fat and carbohydrate induced hyperlipemia 02/23/2016   Osteoporosis, post-menopausal 02/23/2016   Deep vein thrombosis of portal vein 02/23/2016   Carotid artery narrowing 12/19/2014   Arteriosclerosis of coronary artery 12/19/2014   Benign essential HTN 12/17/2014   Chest pain 12/17/2014   Breathlessness on exertion 12/17/2014   Long term current use of anticoagulant 03/15/2014    Orientation RESPIRATION BLADDER Height & Weight     Self, Time, Situation  Normal External catheter Weight: 68 kg Height:  5' (152.4 cm)  BEHAVIORAL SYMPTOMS/MOOD NEUROLOGICAL BOWEL NUTRITION STATUS  Other (Comment) (None)  (None) Continent Diet (Regular)  AMBULATORY STATUS COMMUNICATION OF NEEDS Skin    Extensive Assist Verbally Skin abrasions (Rt leg, Lt arm, apply vaseline and gauze)                       Personal Care Assistance Level of Assistance  Bathing, Feeding, Dressing Bathing Assistance: Limited assistance Feeding assistance: Independent Dressing Assistance: Limited assistance     Functional Limitations Info  Sight, Hearing, Speech Sight Info: Adequate Hearing Info: Adequate Speech Info: Adequate    SPECIAL CARE FACTORS FREQUENCY  PT (By licensed PT), OT (By licensed OT)     PT Frequency: 5x week OT Frequency: 5x week            Contractures Contractures Info: Not present    Additional Factors Info  Code Status, Allergies Code Status Info: Full Allergies Info: Amlodipine    Avelox (moxifloxacin Hcl In Nacl)     Bextra (valdecoxib)    Chlorzoxazone   Fosamax (alendronate Sodium)     Guaifenesin & Derivatives     Lodine (etodolac)     Methocarbamol GI upset  Penicillins      Prednisone           Current Medications (12/14/2023):  This is the current hospital active medication list Current Facility-Administered Medications  Medication Dose Route Frequency Provider Last Rate Last Admin   acetaminophen (TYLENOL) tablet 650 mg  650 mg Oral Q6H PRN Lorretta Harp, MD   650 mg at 12/14/23 0912   cyclobenzaprine (FLEXERIL) tablet 5 mg  5 mg Oral TID PRN Lorretta Harp, MD   5 mg at 12/12/23 1612   docusate sodium (COLACE) capsule 100 mg  100 mg Oral BID Reinaldo Berber, MD  100 mg at 12/14/23 0846   enoxaparin (LOVENOX) injection 70 mg  70 mg Subcutaneous Q12H Rockwell Alexandria, RPH   70 mg at 12/14/23 0848   gabapentin (NEURONTIN) capsule 100 mg  100 mg Oral TID Kathrynn Running, MD   100 mg at 12/14/23 0865   hydrALAZINE (APRESOLINE) injection 10 mg  10 mg Intravenous Q2H PRN Lorretta Harp, MD       lidocaine (LIDODERM) 5 % 1 patch  1 patch Transdermal Q24H Lorretta Harp, MD   1 patch at 12/13/23 2116   menthol-cetylpyridinium (CEPACOL) lozenge 3 mg  1 lozenge Oral PRN  Reinaldo Berber, MD   3 mg at 12/12/23 0024   Or   phenol (CHLORASEPTIC) mouth spray 1 spray  1 spray Mouth/Throat PRN Reinaldo Berber, MD       metoCLOPramide (REGLAN) tablet 5-10 mg  5-10 mg Oral Q8H PRN Reinaldo Berber, MD       Or   metoCLOPramide (REGLAN) injection 5-10 mg  5-10 mg Intravenous Q8H PRN Reinaldo Berber, MD       morphine (PF) 2 MG/ML injection 1 mg  1 mg Intravenous Q4H PRN Lorretta Harp, MD   1 mg at 12/11/23 0049   multivitamin with minerals tablet 1 tablet  1 tablet Oral Daily Kathrynn Running, MD   1 tablet at 12/14/23 0846   ondansetron [facility]) injection 4 mg  4 mg Intravenous Q8H PRN Lorretta Harp, MD   4 mg at 12/11/23 0316   oxyCODONE-acetaminophen (PERCOCET/ROXICET) 5-325 MG per tablet 1 tablet  1 tablet Oral Q4H PRN Lorretta Harp, MD   1 tablet at 12/13/23 1051   pantoprazole (PROTONIX) EC tablet 40 mg  40 mg Oral Daily Lorretta Harp, MD   40 mg at 12/14/23 0846   polyethylene glycol (MIRALAX / GLYCOLAX) packet 34 g  34 g Oral Daily Kathrynn Running, MD   34 g at 12/13/23 1012   polyvinyl alcohol (LIQUIFILM TEARS) 1.4 % ophthalmic solution 1 drop  1 drop Both Eyes Daily Lorretta Harp, MD   1 drop at 12/14/23 0847   rosuvastatin (CRESTOR) tablet 5 mg  5 mg Oral BID Lorretta Harp, MD   5 mg at 12/14/23 7846   senna-docusate (Senokot-S) tablet 1 tablet  1 tablet Oral QHS Kathrynn Running, MD   1 tablet at 12/12/23 2113   Warfarin - Pharmacist Dosing Inpatient   Does not apply q1600 Orson Aloe, [facility]   Given at 12/13/23 1850     Discharge Medications: Please see discharge summary for a list of discharge medications.  Relevant Imaging Results:  Relevant Lab Results:   Additional Information Left leg fracture. SS# [id number]  Hetty Ely, RN

## 2023-12-14 NOTE — Plan of Care (Signed)
  Problem: Education: Goal: Knowledge of General Education information will improve Description: Including pain rating scale, medication(s)/side effects and non-pharmacologic comfort measures Outcome: Progressing   Problem: Health Behavior/Discharge Planning: Goal: Ability to manage health-related needs will improve Outcome: Progressing   Problem: Clinical Measurements: Goal: Will remain free from infection Outcome: Progressing Goal: Diagnostic test results will improve Outcome: Progressing Goal: Respiratory complications will improve Outcome: Progressing Goal: Cardiovascular complication will be avoided Outcome: Progressing   Problem: Activity: Goal: Risk for activity intolerance will decrease Outcome: Progressing   Problem: Coping: Goal: Level of anxiety will decrease Outcome: Progressing   Problem: Pain Managment: Goal: General experience of comfort will improve and/or be controlled Outcome: Progressing   Problem: Safety: Goal: Ability to remain free from injury will improve Outcome: Progressing   Problem: Skin Integrity: Goal: Risk for impaired skin integrity will decrease Outcome: Progressing   Problem: Activity: Goal: Ability to ambulate and perform ADLs will improve Outcome: Progressing   Problem: Clinical Measurements: Goal: Postoperative complications will be avoided or minimized Outcome: Progressing   Problem: Pain Management: Goal: Pain level will decrease Outcome: Progressing

## 2023-12-14 NOTE — Progress Notes (Signed)
   Subjective: 3 Days Post-Op Procedure(s) (LRB): INTRAMEDULLARY (IM) NAIL INTERTROCHANTERIC (Left) Patient reports pain as mild.   Patient is well, and has had no acute complaints or problems Denies any CP, SOB, ABD pain. We will continue therapy today.  Plan is to go Home after hospital stay.  Objective: Vital signs in last 24 hours: Temp:  [98.4 F (36.9 C)-98.7 F (37.1 C)] 98.7 F (37.1 C) (02/01 0012) Pulse Rate:  [78-82] 82 (02/01 0012) Resp:  [16-18] 18 (02/01 0012) BP: (120-135)/(65) 120/65 (02/01 0012) SpO2:  [94 %-100 %] 94 % (02/01 0012)  Intake/Output from previous day: 01/31 0701 - 02/01 0700 In: 600 [P.O.:600] Out: 900 [Urine:900] Intake/Output this shift: Total I/O In: -  Out: 900 [Urine:900]  Recent Labs    12/11/23 1006 12/12/23 0410 12/13/23 0722 12/14/23 0439  HGB 10.8* 9.4* 10.0* 9.6*   Recent Labs    12/13/23 0722 12/14/23 0439  WBC 5.8 6.5  RBC 3.05* 2.88*  HCT 30.0* 28.0*  PLT 228 248   Recent Labs    12/12/23 0410 12/13/23 0722  NA 134* 136  K 4.2 4.5  CL 100 99  CO2 26 26  BUN 13 13  CREATININE 1.05* 0.82  GLUCOSE 116* 115*  CALCIUM 8.4* 8.3*   Recent Labs    12/12/23 0410 12/13/23 0722  INR 1.3* 1.7*    EXAM General - Patient is Alert, Appropriate, and Oriented Extremity - Neurovascular intact Sensation intact distally Intact pulses distally Dorsiflexion/Plantar flexion intact Dressing - dressing C/D/I and no drainage Motor Function - intact, moving foot and toes well on exam.  Ambulated 2 feet with physical therapy  Past Medical History:  Diagnosis Date   Arthritis    Atrophic vaginitis    Chronic kidney disease    Coronary artery disease    GERD (gastroesophageal reflux disease)    Hematuria    microscopic   Hepatitis    Hyperlipidemia    Hypertension    Kidney stone    Peripheral vascular disease (HCC)    Portal vein thrombosis     Assessment/Plan:   3 Days Post-Op Procedure(s)  (LRB): INTRAMEDULLARY (IM) NAIL INTERTROCHANTERIC (Left) Principal Problem:   Femur fracture, left (HCC) Active Problems:   Benign essential HTN   Deep vein thrombosis of portal vein   HLD (hyperlipidemia)   Chronic diastolic CHF (congestive heart failure) (HCC)   Chronic kidney disease, stage 3a (HCC)   Overweight (BMI 25.0-29.9)   Fall at home, initial encounter   Hyponatremia  Estimated body mass index is 29.28 kg/m as calculated from the following:   Height as of this encounter: 5' (1.524 m).   Weight as of this encounter: 68 kg. Advance diet Up with therapy Pain well controlled Labs and VSS INR 1.3, bridge with lovenox CM to assist with discharge to rehab  DVT Prophylaxis - Lovenox and Coumadin TEDS SCDs Weight-Bearing as tolerated to left leg   Dedra Skeens PA-C [facility] 12/14/2023, 6:15 AM

## 2023-12-14 NOTE — Progress Notes (Signed)
[facility]  - [place] [facility]   PATIENT NAME: Mary Buckley    MR#:  [id number]  DATE OF BIRTH:  [birth year]  SUBJECTIVE:  son at bedside. Patient seen earlier. Patient had episode of ortho stasis yesterday while working with PT. Taken Tylenol earlier today. Tolerating PO diet well.    VITALS:  Blood pressure (!) 123/56, pulse 80, temperature 98.2 F (36.8 C), temperature source Oral, resp. rate 16, height 5' (1.524 m), weight 68 kg, SpO2 95%.  PHYSICAL EXAMINATION:   GENERAL:  83 y.o.-year-old patient with no acute distress.  LUNGS: Normal breath sounds bilaterally, no wheezing CARDIOVASCULAR: S1, S2 normal. No murmur   ABDOMEN: Soft, nontender, nondistended. Bowel sounds present.  EXTREMITIES: No  edema b/l.    NEUROLOGIC: nonfocal  patient is alert and awake   LABORATORY PANEL:  CBC Recent Labs  Lab 12/14/23 0439  WBC 6.5  HGB 9.6*  HCT 28.0*  PLT 248    Chemistries  Recent Labs  Lab 12/14/23 0439  NA 129*  K 4.5  CL 97*  CO2 24  GLUCOSE 113*  BUN 14  CREATININE 0.72  CALCIUM 8.0*   Assessment and Plan  Mary Buckley is a 83 y.o. 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   Patient states that she accidentally fell when she was walking at home.   Acute left femoral shaft fracture -After fall, s/p operative repair with nail on 1/29 with Dr. Audelia Acton. Hgb stable post-op - PT/OT, pain control, bowel regimen      Orthostasis yesterday with OT - push fluids - holding home metop/imdur   Portal vein thrombosis On coumadin at home, reversed prior to surgery. Inr 1.7 today - now on lovenox bridge to coumadin --INR 2.1--d/c lovenox bridge   Hypoxia Resolved, cxr clear   Osteoporosis On prolia outpt, follows w/ endo - will need endo f/u     HTN Orthostatic as above - hold home imdur, bisoprolol    Hyponatremia resolved - monitor   Chronic pain - home gabapentin     DVT prophylaxis:coumadin Code Status: full Family Communication: son updated at bedside today 2/1 Level of care: Telemetry Medical Status is: Inpatient Remains inpatient appropriate because: awaiting SNF bed    TOTAL TIME TAKING CARE OF THIS PATIENT: 35 minutes.  >50% time spent on counselling and coordination of care  Note: This dictation was prepared with Dragon dictation along with smaller phrase technology. Any transcriptional errors that result from this process are unintentional.  Enedina Finner M.D    [facility]   CC: Primary care physician; Marguarite Arbour, MD

## 2023-12-15 DIAGNOSIS — I951 Orthostatic hypotension: Secondary | ICD-10-CM

## 2023-12-15 DIAGNOSIS — N1831 Chronic kidney disease, stage 3a: Secondary | ICD-10-CM | POA: Diagnosis not present

## 2023-12-15 DIAGNOSIS — E871 Hypo-osmolality and hyponatremia: Secondary | ICD-10-CM | POA: Diagnosis not present

## 2023-12-15 DIAGNOSIS — S72322A Displaced transverse fracture of shaft of left femur, initial encounter for closed fracture: Secondary | ICD-10-CM | POA: Diagnosis not present

## 2023-12-15 LAB — PROTIME-INR
INR: 1.9 — ABNORMAL HIGH (ref 0.8–1.2)
Prothrombin Time: 21.8 s — ABNORMAL HIGH (ref 11.4–15.2)

## 2023-12-15 MED ORDER — ENOXAPARIN SODIUM 40 MG/0.4ML IJ SOSY
40.0000 mg | PREFILLED_SYRINGE | INTRAMUSCULAR | Status: DC
  Administered 2023-12-15: 40 mg via SUBCUTANEOUS
  Filled 2023-12-15: qty 0.4

## 2023-12-15 MED ORDER — WARFARIN SODIUM 3 MG PO TABS
3.0000 mg | ORAL_TABLET | Freq: Once | ORAL | Status: AC
  Administered 2023-12-15: 3 mg via ORAL
  Filled 2023-12-15: qty 1

## 2023-12-15 NOTE — Consult Note (Addendum)
Pharmacy Consult Note - Anticoagulation  Pharmacy Consult for warfarin Indication:  h/o portal vein thrombosis  PATIENT MEASUREMENTS: Height: 5' (152.4 cm) Weight: 68 kg (149 lb 14.6 oz) IBW/kg (Calculated) : 45.5 HEPARIN DW (KG): 60.2  VITAL SIGNS: Temp: 98.1 F (36.7 C) (02/02 0807) BP: 131/49 (02/02 0807) Pulse Rate: 75 (02/02 0807)  Recent Labs    12/14/23 0439 12/15/23 0406  HGB 9.6*  --   HCT 28.0*  --   PLT 248  --   LABPROT 24.0* 21.8*  INR 2.1* 1.9*  CREATININE 0.72  --     Estimated Creatinine Clearance: 46.6 mL/min (by C-G formula based on SCr of 0.72 mg/dL).  PAST MEDICAL HISTORY: Past Medical History:  Diagnosis Date   Arthritis    Atrophic vaginitis    Chronic kidney disease    Coronary artery disease    GERD (gastroesophageal reflux disease)    Hematuria    microscopic   Hepatitis    Hyperlipidemia    Hypertension    Kidney stone    Peripheral vascular disease (HCC)    Portal vein thrombosis    ASSESSMENT: 83 y.o. female with PMH including portal vein thrombosis is presenting with femoral fracture. Patient is on chronic anticoagulation with warfarin per chart review. Last dose unknown and patient currently in PACU. Her INR was slightly subtherapeutic at 1.9 upon presentation, and patient did receive vitamin K 2 mg IV x 1 prior to orthopedic surgery. Pharmacy has been consulted to initiate and manage heparin intravenous infusion.  Pertinent medications: Warfarin 2 mg Mon, Tues, Thurs, Fri Warfarin 1 mg Sun, Wed, Sat Total weekly warfarin = 11 mg  New drug-drug interactions: N/A  Goal(s) of therapy: INR 2 - 3 Monitor platelets by anticoagulation protocol: Yes   Baseline anticoagulation labs: Recent Labs    12/13/23 0722 12/14/23 0439 12/15/23 0406  INR 1.7* 2.1* 1.9*  HGB 10.0* 9.6*  --   PLT 228 248  --    Date INR Warfarin Dose  1/29 1.7 3 mg 1/30 1.3 5 mg 1/31 1.7 3 mg 2/1 2.1 1 mg 2/2 1.9 3 mg   PLAN: INR is slightly  subtherapeutic. Will give warfarin 3 mg x 1 tonight (3x home dose). Predict INR may continue to drop 2/3. Daily INR. CBC at least every 3 days. Will start enoxaparin 40 mg daily for DVT ppx.   Thank you for involving pharmacy in this patient's care.   Ronnald Ramp, PharmD, BCPS 12/15/2023 10:24 AM

## 2023-12-15 NOTE — Progress Notes (Signed)
   Subjective: 4 Days Post-Op Procedure(s) (LRB): INTRAMEDULLARY (IM) NAIL INTERTROCHANTERIC (Left) Patient reports pain as mild.   Patient is well, and has had no acute complaints or problems Denies any CP, SOB, ABD pain. We will continue therapy today.  Plan is to go to Rehab after hospital stay.  Objective: Vital signs in last 24 hours: Temp:  [98.2 F (36.8 C)-98.8 F (37.1 C)] 98.8 F (37.1 C) (02/01 2309) Pulse Rate:  [79-81] 79 (02/01 2309) Resp:  [16-17] 16 (02/01 2309) BP: (106-128)/(56-72) 128/61 (02/01 2309) SpO2:  [94 %-96 %] 94 % (02/01 2309)  Intake/Output from previous day: 02/01 0701 - 02/02 0700 In: 1180 [P.O.:680] Out: 2850 [Urine:2850] Intake/Output this shift: No intake/output data recorded.  Recent Labs    12/13/23 0722 12/14/23 0439  HGB 10.0* 9.6*   Recent Labs    12/13/23 0722 12/14/23 0439  WBC 5.8 6.5  RBC 3.05* 2.88*  HCT 30.0* 28.0*  PLT 228 248   Recent Labs    12/13/23 0722 12/14/23 0439  NA 136 129*  K 4.5 4.5  CL 99 97*  CO2 26 24  BUN 13 14  CREATININE 0.82 0.72  GLUCOSE 115* 113*  CALCIUM 8.3* 8.0*   Recent Labs    12/14/23 0439 12/15/23 0406  INR 2.1* 1.9*    EXAM General - Patient is Alert, Appropriate, and Oriented Extremity - Neurovascular intact Sensation intact distally Intact pulses distally Dorsiflexion/Plantar flexion intact Dressing - dressing C/D/I and no drainage Motor Function - intact, moving foot and toes well on exam.  Ambulated 2 feet with physical therapy  Past Medical History:  Diagnosis Date   Arthritis    Atrophic vaginitis    Chronic kidney disease    Coronary artery disease    GERD (gastroesophageal reflux disease)    Hematuria    microscopic   Hepatitis    Hyperlipidemia    Hypertension    Kidney stone    Peripheral vascular disease (HCC)    Portal vein thrombosis     Assessment/Plan:   4 Days Post-Op Procedure(s) (LRB): INTRAMEDULLARY (IM) NAIL INTERTROCHANTERIC  (Left) Principal Problem:   Femur fracture, left (HCC) Active Problems:   Benign essential HTN   Deep vein thrombosis of portal vein   HLD (hyperlipidemia)   Chronic diastolic CHF (congestive heart failure) (HCC)   Chronic kidney disease, stage 3a (HCC)   Overweight (BMI 25.0-29.9)   Fall at home, initial encounter   Hyponatremia  Estimated body mass index is 29.28 kg/m as calculated from the following:   Height as of this encounter: 5' (1.524 m).   Weight as of this encounter: 68 kg. Advance diet Up with therapy Pain well controlled Labs and VSS INR 1.3, bridge with lovenox CM to assist with discharge to rehab  DVT Prophylaxis - Lovenox and Coumadin TEDS SCDs Weight-Bearing as tolerated to left leg   Dedra Skeens PA-C [facility] 12/15/2023, 7:14 AM

## 2023-12-15 NOTE — Plan of Care (Signed)

## 2023-12-15 NOTE — Progress Notes (Signed)
[facility]  - [place] [facility]   PATIENT NAME: Mary Buckley    MR#:  [id number]  DATE OF BIRTH:  [birth year]  SUBJECTIVE:  Dter at bedside. Patient seen earlier. Patient did fairly well with physical therapy. Blood pressure did drop some however patient was not much symptomatic. Continue oral hydration. Continue Tylenol for pain.   VITALS:  Blood pressure (!) 131/49, pulse 75, temperature 98.1 F (36.7 C), resp. rate 16, height 5' (1.524 m), weight 68 kg, SpO2 97%.  PHYSICAL EXAMINATION:   GENERAL:  83 y.o.-year-old patient with no acute distress.  LUNGS: Normal breath sounds bilaterally, no wheezing CARDIOVASCULAR: S1, S2 normal. No murmur   ABDOMEN: Soft, nontender, nondistended. Bowel sounds present.  EXTREMITIES: No  edema b/l.    NEUROLOGIC: nonfocal  patient is alert and awake  LABORATORY PANEL:  CBC Recent Labs  Lab 12/14/23 0439  WBC 6.5  HGB 9.6*  HCT 28.0*  PLT 248    Chemistries  Recent Labs  Lab 12/14/23 0439  NA 129*  K 4.5  CL 97*  CO2 24  GLUCOSE 113*  BUN 14  CREATININE 0.72  CALCIUM 8.0*   Assessment and Plan  Mary Buckley is a 83 y.o. 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   Patient states that she accidentally fell when she was walking at home.   Acute left femoral shaft fracture -After fall, s/p operative repair with nail on 1/29 with Dr. Audelia Acton. Hgb stable post-op - PT/OT, pain control, bowel regimen      Orthostasis -- I received IV fluids --holding home metop/imdur   Portal vein thrombosis On coumadin at home, reversed prior to surgery. Inr 1.7 today - now on lovenox bridge to coumadin --INR 2.1--d/c lovenox bridge   Hypoxia Resolved, cxr clear   Osteoporosis On prolia outpt, follows w/ endo - will need endo f/u     HTN Orthostatic as above - hold home imdur, bisoprolol    Hyponatremia --improving   Chronic pain - home gabapentin     DVT prophylaxis:coumadin Code Status: full Family Communication: dter updated at bedside today 2/2 Level of care: Telemetry Medical Status is: Inpatient Remains inpatient appropriate because: awaiting SNF bed    TOTAL TIME TAKING CARE OF THIS PATIENT: 35 minutes.  >50% time spent on counselling and coordination of care  Note: This dictation was prepared with Dragon dictation along with smaller phrase technology. Any transcriptional errors that result from this process are unintentional.  Enedina Finner M.D    [facility]   CC: Primary care physician; Marguarite Arbour, MD

## 2023-12-15 NOTE — Progress Notes (Signed)
Physical Therapy Treatment Patient Details Name: Mary Buckley MRN: [id number] DOB: 1941-06-17 Today's Date: 12/15/2023   History of Present Illness Mary Buckley is a 83 y.o. 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 Found to have a left femoral shaft fracture now s/p IM nail 12/11/23.    PT Comments  Pt making steady progress with functional mobility. Able to tolerate OOB transfer to recliner chair this session; however, further gait training was deferred due to inconsistent BP and pt with reports of dizziness. MD messaged and aware. Pt would continue to benefit from skilled physical therapy services at this time while admitted and after d/c to address the below listed limitations in order to improve overall safety and independence with functional mobility.  BP supine = 145/82 BP sitting EOB = 121/82 BP standing = 146/102 BP sitting in recliner chair at end of session = 105/65 (MD messaged via EPIC chat and aware.)     If plan is discharge home, recommend the following: A lot of help with walking and/or transfers;A lot of help with bathing/dressing/bathroom;Assistance with cooking/housework;Assist for transportation;Help with stairs or ramp for entrance;Supervision due to cognitive status   Can travel by private vehicle     No  Equipment Recommendations  Other (comment) (defer to next venue of care)    Recommendations for Other Services       Precautions / Restrictions Precautions Precautions: Fall Precaution Comments: monitor BP Restrictions Weight Bearing Restrictions Per Provider Order: Yes LLE Weight Bearing Per Provider Order: Weight bearing as tolerated     Mobility  Bed Mobility Overal bed mobility: Needs Assistance Bed Mobility: Supine to Sit     Supine to sit: Min assist      General bed mobility comments: increased time and effort, min A for L LE movement off of bed and for trunk elevation to achieve an upright sitting position    Transfers Overall transfer level: Needs assistance Equipment used: Rolling walker (2 wheels) Transfers: Sit to/from Stand Sit to Stand: Min assist   Step pivot transfers: Min assist       General transfer comment: pt able to transfer from bed to recliner chair towards her R side with use of RW and min A from PT for stability    Ambulation/Gait               General Gait Details: deferred due to inconsistency with BP readings and pt with reports of dizziness throughout   Stairs             Wheelchair Mobility     Tilt Bed    Modified Rankin (Stroke Patients Only)       Balance Overall balance assessment: Needs assistance Sitting-balance support: No upper extremity supported, Feet supported Sitting balance-Leahy Scale: Good     Standing balance support: Bilateral upper extremity supported, During functional activity, Reliant on assistive device for balance Standing balance-Leahy Scale: Poor                              Cognition Arousal: Alert Behavior During Therapy: WFL for tasks assessed/performed Overall Cognitive Status: Within Functional Limits for tasks assessed  Exercises      General Comments        Pertinent Vitals/Pain Pain Assessment Pain Assessment: No/denies pain Pain Intervention(s): Monitored during session    Home Living                          Prior Function            PT Goals (current goals can now be found in the care plan section) Acute Rehab PT Goals PT Goal Formulation: With patient Time For Goal Achievement: 12/26/23 Potential to Achieve Goals: Good Progress towards PT goals: Progressing toward goals    Frequency    7X/week      PT Plan      Co-evaluation               AM-PAC PT "6 Clicks" Mobility   Outcome Measure  Help needed turning from your back to your side while in a flat bed without using bedrails?: A Little Help needed moving from lying on your back to sitting on the side of a flat bed without using bedrails?: A Little Help needed moving to and from a bed to a chair (including a wheelchair)?: A Little Help needed standing up from a chair using your arms (e.g., wheelchair or bedside chair)?: A Little Help needed to walk in hospital room?: A Lot Help needed climbing 3-5 steps with a railing? : Total 6 Click Score: 15    End of Session   Activity Tolerance: Patient tolerated treatment well Patient left: in chair;with call bell/phone within reach;with family/visitor present Nurse Communication: Mobility status PT Visit Diagnosis: Muscle weakness (generalized) (M62.81);History of falling (Z91.81);Difficulty in walking, not elsewhere classified (R26.2);Pain Pain - Right/Left: Left Pain - part of body: Hip     Time: 0981-1914 PT Time Calculation (min) (ACUTE ONLY): 28 min  Charges:    $Therapeutic Activity: 23-37 mins PT General Charges $$ ACUTE PT VISIT: 1 Visit                     Arletta Bale, DPT  Acute Rehabilitation Services Office [phone]    Alessandra Bevels Kymberly Blomberg 12/15/2023, 12:49 PM

## 2023-12-15 NOTE — Plan of Care (Signed)

## 2023-12-16 DIAGNOSIS — I81 Portal vein thrombosis: Secondary | ICD-10-CM | POA: Diagnosis not present

## 2023-12-16 DIAGNOSIS — I951 Orthostatic hypotension: Secondary | ICD-10-CM

## 2023-12-16 DIAGNOSIS — S72322A Displaced transverse fracture of shaft of left femur, initial encounter for closed fracture: Secondary | ICD-10-CM | POA: Diagnosis not present

## 2023-12-16 DIAGNOSIS — Y92009 Unspecified place in unspecified non-institutional (private) residence as the place of occurrence of the external cause: Secondary | ICD-10-CM

## 2023-12-16 DIAGNOSIS — N1831 Chronic kidney disease, stage 3a: Secondary | ICD-10-CM | POA: Diagnosis not present

## 2023-12-16 DIAGNOSIS — W19XXXA Unspecified fall, initial encounter: Secondary | ICD-10-CM | POA: Diagnosis not present

## 2023-12-16 LAB — PROTIME-INR
INR: 1.9 — ABNORMAL HIGH (ref 0.8–1.2)
Prothrombin Time: 22.3 s — ABNORMAL HIGH (ref 11.4–15.2)

## 2023-12-16 LAB — BASIC METABOLIC PANEL
Anion gap: 7 (ref 5–15)
BUN: 16 mg/dL (ref 8–23)
CO2: 25 mmol/L (ref 22–32)
Calcium: 8.5 mg/dL — ABNORMAL LOW (ref 8.9–10.3)
Chloride: 98 mmol/L (ref 98–111)
Creatinine, Ser: 0.79 mg/dL (ref 0.44–1.00)
GFR, Estimated: >60 mL/min (ref >60)
Glucose, Bld: 130 mg/dL — ABNORMAL HIGH (ref 70–99)
Potassium: 4.7 mmol/L (ref 3.5–5.1)
Sodium: 130 mmol/L — ABNORMAL LOW (ref 135–145)

## 2023-12-16 LAB — HEMOGLOBIN: Hemoglobin: 9.4 g/dL — ABNORMAL LOW (ref 12.0–15.0)

## 2023-12-16 MED ORDER — WARFARIN SODIUM 2.5 MG PO TABS
3.5000 mg | ORAL_TABLET | Freq: Once | ORAL | Status: AC
  Administered 2023-12-16: 3.5 mg via ORAL
  Filled 2023-12-16: qty 1

## 2023-12-16 MED ORDER — WARFARIN SODIUM 3 MG PO TABS
3.0000 mg | ORAL_TABLET | Freq: Once | ORAL | Status: DC
  Filled 2023-12-16: qty 1

## 2023-12-16 NOTE — Progress Notes (Signed)
Occupational Therapy Treatment Patient Details Name: Mary Buckley MRN: [id number] DOB: 19-Apr-1941 Today's Date: 12/16/2023   History of present illness Mary Buckley is a 83 y.o. 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 Found to have a left femoral shaft fracture now s/p IM nail 12/11/23.   OT comments  Pt seen for OT tx. Pt endorses mild LLE pain with movement, RN notified. Pt eager to participate. BP monitored in sitting and standing. Pt unable to tolerate standing longer than ~42min 2/2 wooziness that did not improve with time and remained (but slowly improving) in sitting. Sitting BP 124/72 HR 87, standing 109/73 HR 103. MIN A to stand and pt able to adjust her hands to improve her transfer technique without VC. Pt completed grooming tasks in sitting 2/2 wooziness. Pt educated in compensatory strategies to maximize safety during ADL and mobility in light of BP drops with positional changes. Pt verbalized understanding. Pt continues to benefit from skilled OT services. Primarily limited by BP issues at this time.       If plan is discharge home, recommend the following:  A lot of help with walking and/or transfers;A lot of help with bathing/dressing/bathroom;Assist for transportation;Help with stairs or ramp for entrance   Equipment Recommendations  BSC/3in1    Recommendations for Other Services      Precautions / Restrictions Precautions Precautions: Fall Precaution Comments: monitor BP Restrictions Weight Bearing Restrictions Per Provider Order: Yes LLE Weight Bearing Per Provider Order: Weight bearing as tolerated Other Position/Activity Restrictions: Orthostatic Hypotension       Mobility Bed Mobility               General bed mobility comments: NT, in recliner at start/end of  session    Transfers Overall transfer level: Needs assistance Equipment used: Rolling walker (2 wheels) Transfers: Sit to/from Stand Sit to Stand: Min assist           General transfer comment: Able to adjust hands for improved positioning without VC     Balance Overall balance assessment: Needs assistance Sitting-balance support: No upper extremity supported, Feet supported Sitting balance-Leahy Scale: Good     Standing balance support: Bilateral upper extremity supported, During functional activity, Reliant on assistive device for balance Standing balance-Leahy Scale: Fair Standing balance comment: limited by hypotension                           ADL either performed or assessed with clinical judgement   ADL                                         General ADL Comments: Unable to tolerate standing long enough to complete grooming tasks 2/2 wooziness with transition to standing that did not resolve with time. Set up to complete brushing teething and washing her face in sitting.    Extremity/Trunk Assessment              Vision       Perception     Praxis      Cognition Arousal: Alert Behavior During Therapy: WFL for tasks assessed/performed Overall Cognitive Status: Within Functional Limits for tasks assessed  Exercises Other Exercises Other Exercises: pt educated in falls prevention and strategies to compensate for orthostatic hypotension during ADL and mobility    Shoulder Instructions       General Comments      Pertinent Vitals/ Pain       Pain Assessment Pain Assessment: 0-10 Pain Score: 6  Pain Location: L knee and hip Pain Descriptors / Indicators: Discomfort, Grimacing, Operative site guarding Pain Intervention(s): Limited activity within patient's tolerance, Monitored during session, Repositioned, Patient requesting pain meds-RN notified  Home Living                                           Prior Functioning/Environment              Frequency  Min 1X/week        Progress Toward Goals  OT Goals(current goals can now be found in the care plan section)  Progress towards OT goals: Progressing toward goals  Acute Rehab OT Goals Patient Stated Goal: go home OT Goal Formulation: With patient/family Time For Goal Achievement: 12/26/23 Potential to Achieve Goals: Good  Plan      Co-evaluation                 AM-PAC OT "6 Clicks" Daily Activity     Outcome Measure   Help from another person eating meals?: None Help from another person taking care of personal grooming?: A Little Help from another person toileting, which includes using toliet, bedpan, or urinal?: A Lot Help from another person bathing (including washing, rinsing, drying)?: A Lot Help from another person to put on and taking off regular upper body clothing?: None Help from another person to put on and taking off regular lower body clothing?: A Lot 6 Click Score: 17    End of Session Equipment Utilized During Treatment: Rolling walker (2 wheels)  OT Visit Diagnosis: Other abnormalities of gait and mobility (R26.89);Muscle weakness (generalized) (M62.81)   Activity Tolerance Patient tolerated treatment well;Other (comment) (woozy with standing)   Patient Left in chair;with call bell/phone within reach;with chair alarm set   Nurse Communication Patient requests pain meds        Time: 0454-0981 OT Time Calculation (min): 26 min  Charges: OT General Charges $OT Visit: 1 Visit OT Treatments $Self Care/Home Management : 8-22 mins $Therapeutic Activity: 8-22 mins  Arman Filter., MPH, MS, OTR/L ascom [phone] 12/16/23, 10:55 AM

## 2023-12-16 NOTE — Consult Note (Addendum)
Pharmacy Consult Note - Anticoagulation  Pharmacy Consult for warfarin Indication:  h/o portal vein thrombosis  PATIENT MEASUREMENTS: Height: 5' (152.4 cm) Weight: 68 kg (149 lb 14.6 oz) IBW/kg (Calculated) : 45.5 HEPARIN DW (KG): 60.2  VITAL SIGNS: Temp: 98.4 F (36.9 C) (02/03 0759) BP: 125/63 (02/03 0759) Pulse Rate: 87 (02/03 0759)  Recent Labs    12/14/23 0439 12/15/23 0406 12/16/23 0415  HGB 9.6*  --  9.4*  HCT 28.0*  --   --   PLT 248  --   --   LABPROT 24.0*   < > 22.3*  INR 2.1*   < > 1.9*  CREATININE 0.72  --  0.79   < > = values in this interval not displayed.    Estimated Creatinine Clearance: 46.6 mL/min (by C-G formula based on SCr of 0.79 mg/dL).  PAST MEDICAL HISTORY: Past Medical History:  Diagnosis Date   Arthritis    Atrophic vaginitis    Chronic kidney disease    Coronary artery disease    GERD (gastroesophageal reflux disease)    Hematuria    microscopic   Hepatitis    Hyperlipidemia    Hypertension    Kidney stone    Peripheral vascular disease (HCC)    Portal vein thrombosis    ASSESSMENT: 83 y.o. female with PMH including portal vein thrombosis is presenting with femoral fracture. Patient is on chronic anticoagulation with warfarin per chart review. Last dose unknown and patient currently in PACU. Her INR was slightly subtherapeutic at 1.9 upon presentation, and patient did receive vitamin K 2 mg IV x 1 prior to orthopedic surgery. Pharmacy has been consulted to initiate and manage heparin intravenous infusion.  Pertinent medications: Warfarin 2 mg Mon, Tues, Thurs, Fri Warfarin 1 mg Sun, Wed, Sat Total weekly warfarin = 11 mg  New drug-drug interactions: N/A  Goal(s) of therapy: INR 2 - 3 Monitor platelets by anticoagulation protocol: Yes   Baseline anticoagulation labs: Recent Labs    12/14/23 0439 12/15/23 0406 12/16/23 0415  INR 2.1* 1.9* 1.9*  HGB 9.6*  --  9.4*  PLT 248  --   --    Date INR Warfarin Dose   1/29 1.7 3 mg 1/30 1.3 5 mg 1/31 1.7 3 mg 2/1 2.1 1 mg 2/2 1.9 3 mg 2/3 1.9    PLAN: INR is still slightly subtherapeutic. Will order warfarin 3.5 mg x 1 tonight again. (Per discussion with Dr. Allena Katz ) Daily INR.  CBC at least every 3 days.  Orthopedics has discontinued lovenox 12/16/23  Thank you for involving pharmacy in this patient's care.   Danija Gosa A, PharmD 12/16/2023 10:34 AM

## 2023-12-16 NOTE — Plan of Care (Signed)

## 2023-12-16 NOTE — Progress Notes (Signed)
   Subjective: 5 Days Post-Op Procedure(s) (LRB): INTRAMEDULLARY (IM) NAIL INTERTROCHANTERIC (Left) Patient reports pain as mild.   Patient is well, and has had no acute complaints or problems Denies any CP, SOB, ABD pain. We will continue therapy today + BM Plan is to discharge to SNF.  Objective: Vital signs in last 24 hours: Temp:  [97.6 F (36.4 C)-98.4 F (36.9 C)] 98.4 F (36.9 C) (02/03 0759) Pulse Rate:  [83-87] 87 (02/03 0759) Resp:  [16-18] 18 (02/03 0759) BP: (125-128)/(63) 125/63 (02/03 0759) SpO2:  [96 %-97 %] 96 % (02/03 0759)  Intake/Output from previous day: 02/02 0701 - 02/03 0700 In: -  Out: 2950 [Urine:2950] Intake/Output this shift: Total I/O In: -  Out: 500 [Urine:500]  Recent Labs    12/14/23 0439 12/16/23 0415  HGB 9.6* 9.4*   Recent Labs    12/14/23 0439  WBC 6.5  RBC 2.88*  HCT 28.0*  PLT 248   Recent Labs    12/14/23 0439 12/16/23 0415  NA 129* 130*  K 4.5 4.7  CL 97* 98  CO2 24 25  BUN 14 16  CREATININE 0.72 0.79  GLUCOSE 113* 130*  CALCIUM 8.0* 8.5*   Recent Labs    12/15/23 0406 12/16/23 0415  INR 1.9* 1.9*    EXAM General - Patient is Alert, Appropriate, and Oriented Extremity - Neurovascular intact Sensation intact distally Intact pulses distally Dorsiflexion/Plantar flexion intact Dressing - dressing C/D/I and no drainage Motor Function - intact, moving foot and toes well on exam.   Past Medical History:  Diagnosis Date   Arthritis    Atrophic vaginitis    Chronic kidney disease    Coronary artery disease    GERD (gastroesophageal reflux disease)    Hematuria    microscopic   Hepatitis    Hyperlipidemia    Hypertension    Kidney stone    Peripheral vascular disease (HCC)    Portal vein thrombosis     Assessment/Plan:   5 Days Post-Op Procedure(s) (LRB): INTRAMEDULLARY (IM) NAIL INTERTROCHANTERIC (Left) Principal Problem:   Femur fracture, left (HCC) Active Problems:   Benign essential  HTN   Deep vein thrombosis of portal vein   HLD (hyperlipidemia)   Chronic diastolic CHF (congestive heart failure) (HCC)   Chronic kidney disease, stage 3a (HCC)   Overweight (BMI 25.0-29.9)   Fall at home, initial encounter   Hyponatremia  Estimated body mass index is 29.28 kg/m as calculated from the following:   Height as of this encounter: 5' (1.524 m).   Weight as of this encounter: 68 kg. Advance diet Up with therapy Pain well controlled Labs and VSS + BM INR 1.9-2.1, dc lovenox CM to assist with discharge to SNF  Ortho signing off Follow up with [facility] in 2 weeks Patient can shower and allow honeycomb dressing to get wet, do not submerge dressings under water. Ok to remove dressings 12/18/23 and shower with no dressing, allow dermabond to fall off on its own  DVT Prophylaxis - Coumadin TEDS SCDs Weight-Bearing as tolerated to left leg   T. Cranston Neighbor, PA-C [facility] 12/16/2023, 10:07 AM

## 2023-12-16 NOTE — Progress Notes (Signed)
[facility]  - [place] at [facility]   PATIENT NAME: Mary Buckley    MR#:  [id number]  DATE OF BIRTH:  05/01/1941  SUBJECTIVE:  no family earlier  at bedside. Patient was trying to work with physical therapy. Blood pressure soft however no orthostatic difference noted. Encourage patient to continue oral hydration and therapy.   VITALS:  Blood pressure 125/63, pulse 87, temperature 98.4 F (36.9 C), resp. rate 18, height 5' (1.524 m), weight 68 kg, SpO2 96%.  PHYSICAL EXAMINATION:   GENERAL:  83 y.o.-year-old patient with no acute distress.  LUNGS: Normal breath sounds bilaterally, no wheezing CARDIOVASCULAR: S1, S2 normal. No murmur   ABDOMEN: Soft, nontender, nondistended. Bowel sounds present.  EXTREMITIES: No  edema b/l.    NEUROLOGIC: nonfocal  patient is alert and awake  LABORATORY PANEL:  CBC Recent Labs  Lab 12/14/23 0439 12/16/23 0415  WBC 6.5  --   HGB 9.6* 9.4*  HCT 28.0*  --   PLT 248  --     Chemistries  Recent Labs  Lab 12/16/23 0415  NA 130*  K 4.7  CL 98  CO2 25  GLUCOSE 130*  BUN 16  CREATININE 0.79  CALCIUM 8.5*   Assessment and Plan  Mary Buckley is a 83 y.o. 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   Patient states that she accidentally fell when she was walking at home.   Acute left femoral shaft fracture -After fall, s/p operative repair with nail on 1/29 with Dr. Audelia Acton. Hgb stable post-op - PT/OT, pain control, bowel regimen      Orthostasis -- received IV fluids --holding home metop/imdur -- encourage oral fluid intake   Portal vein thrombosis On coumadin at home, reversed prior to surgery. - now on lovenox bridge to coumadin---discontinued Lovenox once INR was 2.1 --INR 1.9-- pharmacy to adjust dose of Coumadin    Hypoxia --Resolved, cxr clear   Osteoporosis --On prolia outpt, follows w/ endo - will need endocrinology f/u    HTN Orthostatic as above - hold home imdur, bisoprolol    Hyponatremia --improving   Chronic pain - home gabapentin     DVT prophylaxis:coumadin Code Status: full Family Communication: none today  level of care: Telemetry Medical Status is: Inpatient Remains inpatient appropriate because: awaiting SNF bed    TOTAL TIME TAKING CARE OF THIS PATIENT: 35 minutes.  >50% time spent on counselling and coordination of care  Note: This dictation was prepared with Dragon dictation along with smaller phrase technology. Any transcriptional errors that result from this process are unintentional.  Enedina Finner M.D    [facility]   CC: Primary care physician; Marguarite Arbour, MD

## 2023-12-16 NOTE — Progress Notes (Signed)
Physical Therapy Treatment Patient Details Name: Mary Buckley MRN: [id number] DOB: 1941-04-28 Today's Date: 12/16/2023   History of Present Illness Mary Buckley is a 83 y.o. 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 Found to have a left femoral shaft fracture now s/p IM nail 12/11/23.    PT Comments  Pt ready for session.  Reports feeling a bit dizzy this AM when she sat up in bed for breakfast but has resolved now.  Participated in exercises as described below.  She is able to transition to sitting with min a x 1 for LE assist and moving hips to EOB.  She is generally steady in sitting with single arm support.  She is able to complete standing orthostatic vitals with c/o feeling "swimmy" but no real increase in symptoms today.  After seated rest she is able to step to chair then take 3 steps forward and back with min a x 1 before she c/o increased dizziness and needing to sit.  Overall tolerated session well and remained in chair after session with needs met.   If plan is discharge home, recommend the following: A lot of help with walking and/or transfers;A lot of help with bathing/dressing/bathroom;Assistance with cooking/housework;Assist for transportation;Help with stairs or ramp for entrance;Supervision due to cognitive status   Can travel by private vehicle        Equipment Recommendations       Recommendations for Other Services       Precautions / Restrictions Precautions Precautions: Fall Precaution Comments: monitor BP Restrictions Weight Bearing Restrictions Per Provider Order: Yes LLE Weight Bearing Per Provider Order: Weight bearing as tolerated     Mobility  Bed Mobility Overal bed mobility: Needs Assistance Bed Mobility: Supine to Sit     Supine to sit: Min assist     General  bed mobility comments: increased time and effort, min A for L LE movement off of bed and for trunk elevation to achieve an upright sitting position    Transfers Overall transfer level: Needs assistance Equipment used: Rolling walker (2 wheels) Transfers: Sit to/from Stand Sit to Stand: Min assist                Ambulation/Gait Ambulation/Gait assistance: Editor, commissioning (Feet): 6 Feet Assistive device: Rolling walker (2 wheels) Gait Pattern/deviations: Step-to pattern, Antalgic Gait velocity: decr     General Gait Details: steps to chair then 3 steps forward and back to chair with some increased "swimmy" feeling   Stairs             Wheelchair Mobility     Tilt Bed    Modified Rankin (Stroke Patients Only)       Balance Overall balance assessment: Needs assistance Sitting-balance support: No upper extremity supported, Feet supported Sitting balance-Leahy Scale: Good     Standing balance support: Bilateral upper extremity supported, During functional activity, Reliant on assistive device for balance Standing balance-Leahy Scale: Fair                              Cognition Arousal: Alert Behavior During Therapy: WFL for tasks assessed/performed Overall Cognitive Status: Within Functional Limits for tasks assessed  Exercises Other Exercises Other Exercises: supine AAROM and standing AROM    General Comments        Pertinent Vitals/Pain Pain Assessment Pain Assessment: Faces Faces Pain Scale: Hurts little more Pain Location: L thigh Pain Descriptors / Indicators: Discomfort, Grimacing, Operative site guarding Pain Intervention(s): Monitored during session, Repositioned    Home Living                          Prior Function            PT Goals (current goals can now be found in the care plan section) Progress towards PT goals: Progressing toward  goals    Frequency    7X/week      PT Plan      Co-evaluation              AM-PAC PT "6 Clicks" Mobility   Outcome Measure  Help needed turning from your back to your side while in a flat bed without using bedrails?: A Little Help needed moving from lying on your back to sitting on the side of a flat bed without using bedrails?: A Little Help needed moving to and from a bed to a chair (including a wheelchair)?: A Little Help needed standing up from a chair using your arms (e.g., wheelchair or bedside chair)?: A Little Help needed to walk in hospital room?: A Little Help needed climbing 3-5 steps with a railing? : Total 6 Click Score: 16    End of Session Equipment Utilized During Treatment: Gait belt Activity Tolerance: Patient tolerated treatment well Patient left: in chair;with call bell/phone within reach;with chair alarm set Nurse Communication: Mobility status PT Visit Diagnosis: Muscle weakness (generalized) (M62.81);History of falling (Z91.81);Difficulty in walking, not elsewhere classified (R26.2);Pain Pain - Right/Left: Left Pain - part of body: Hip     Time: 1610-9604 PT Time Calculation (min) (ACUTE ONLY): 21 min  Charges:    $Therapeutic Activity: 8-22 mins PT General Charges $$ ACUTE PT VISIT: 1 Visit                   Danielle Dess, PTA 12/16/23, 9:49 AM

## 2023-12-16 NOTE — Plan of Care (Signed)

## 2023-12-17 DIAGNOSIS — I81 Portal vein thrombosis: Secondary | ICD-10-CM | POA: Diagnosis not present

## 2023-12-17 DIAGNOSIS — W19XXXA Unspecified fall, initial encounter: Secondary | ICD-10-CM | POA: Diagnosis not present

## 2023-12-17 DIAGNOSIS — S72322A Displaced transverse fracture of shaft of left femur, initial encounter for closed fracture: Secondary | ICD-10-CM | POA: Diagnosis not present

## 2023-12-17 DIAGNOSIS — N1831 Chronic kidney disease, stage 3a: Secondary | ICD-10-CM | POA: Diagnosis not present

## 2023-12-17 LAB — PROTIME-INR
INR: 2.2 — ABNORMAL HIGH (ref 0.8–1.2)
Prothrombin Time: 24.5 s — ABNORMAL HIGH (ref 11.4–15.2)

## 2023-12-17 MED ORDER — WARFARIN SODIUM 2 MG PO TABS
2.0000 mg | ORAL_TABLET | Freq: Once | ORAL | Status: AC
  Administered 2023-12-17: 2 mg via ORAL
  Filled 2023-12-17: qty 1

## 2023-12-17 NOTE — TOC Progression Note (Signed)
 Transition of Care [facility]) - Progression Note    Patient Details  Name: Mary Buckley MRN: [id number] Date of Birth: 02-03-1941  [facility] CM/SW Contact  Ziomara Birenbaum A Chardonay Scritchfield, RN Phone Number: 12/17/2023, 10:13 PM  Clinical Narrative:    Chart reviewed.  Reviewed bed offers with patient's daughter Mrs. Norword.  Patient and her mother has chosen to go to [facility].  I have spoken with Debe in Admissions and he informs me that he will have a bed for patient on tomorrow.    TOC will continue to follow for discharge planning.          Expected Discharge Plan and Services                                               Social Determinants of Health (SDOH) Interventions SDOH Screenings   Food Insecurity: No Food Insecurity (12/11/2023)  Housing: Patient Declined (12/11/2023)  Transportation Needs: Patient Declined (12/11/2023)  Utilities: Patient Declined (12/11/2023)  Social Connections: Unknown (12/11/2023)  Tobacco Use: Low Risk  (12/11/2023)    Readmission Risk Interventions     No data to display

## 2023-12-17 NOTE — Progress Notes (Signed)
 [facility]  - [place] [facility]   PATIENT NAME: Mary Buckley    MR#:  [id number]  DATE OF BIRTH:  [birth year]  SUBJECTIVE:  no family earlier  at bedside. Patient did very well with physical therapy today. Dizziness/swimming head feeling is improving. She was able to tolerate more PT today.  VITALS:  Blood pressure (!) 115/50, pulse 88, temperature 98.6 F (37 C), temperature source Oral, resp. rate 18, height 5' (1.524 m), weight 68 kg, SpO2 97%.  PHYSICAL EXAMINATION:   GENERAL:  83 y.o.-year-old patient with no acute distress.  LUNGS: Normal breath sounds bilaterally, no wheezing CARDIOVASCULAR: S1, S2 normal. No murmur   ABDOMEN: Soft, nontender, nondistended. Bowel sounds present.  EXTREMITIES: No  edema b/l.    NEUROLOGIC: nonfocal  patient is alert and awake  LABORATORY PANEL:  CBC Recent Labs  Lab 12/14/23 0439 12/16/23 0415  WBC 6.5  --   HGB 9.6* 9.4*  HCT 28.0*  --   PLT 248  --     Chemistries  Recent Labs  Lab 12/16/23 0415  NA 130*  K 4.7  CL 98  CO2 25  GLUCOSE 130*  BUN 16  CREATININE 0.79  CALCIUM  8.5*   Assessment and Plan  Mary Buckley is a 83 y.o. 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   Patient states that she accidentally fell when she was walking at home.   Acute left femoral shaft fracture -After fall, s/p operative repair with nail on 1/29 with Dr. Lorelle. Hgb stable post-op - PT/OT, pain control, bowel regimen      Orthostasis--improving -- received IV fluids --holding home metop/imdur  since bp still on the sfter side -- encourage oral fluid intake   Portal vein thrombosis On coumadin  at home, reversed prior to surgery. - now on lovenox  bridge to coumadin ---discontinued Lovenox  once INR was 2.1 --INR 2.2-- pharmacy to  adjust dose of Coumadin    Hypoxia --Resolved, cxr clear   Osteoporosis --On prolia outpt, follows w/ endo --will need endocrinology f/u    HTN Orthostatic as above - hold home imdur , bisoprolol    Hyponatremia --improving   Chronic pain - home gabapentin      DVT prophylaxis:coumadin  Code Status: full Family Communication: none today  level of care: Telemetry Medical Status is: Inpatient Remains inpatient appropriate because: awaiting SNF bed. Pt is medically best at baseline.    TOTAL TIME TAKING CARE OF THIS PATIENT: 35 minutes.  >50% time spent on counselling and coordination of care  Note: This dictation was prepared with Dragon dictation along with smaller phrase technology. Any transcriptional errors that result from this process are unintentional.  Leita Blanch M.D    [facility]   CC: Primary care physician; Auston Reyes BIRCH, MD

## 2023-12-17 NOTE — Progress Notes (Signed)
 Nutrition Follow-up  DOCUMENTATION CODES:   Not applicable  INTERVENTION:   -Continue regular diet -Continue MVI with minerals daily -Continue Magic cup TID with meals, each supplement provides 290 kcal and 9 grams of protein   NUTRITION DIAGNOSIS:   Increased nutrient needs related to post-op healing as evidenced by estimated needs.  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  1/296- s/p INTRAMEDULLARY (IM) NAIL INTERTROCHANTERIC (Left)   Reviewed I/O's: -3.4 L x 24 hours and -10.2 L since admission  UOP: 3.4 L x 24 hours  Per MD notes, pt has been experiencing orthostasis; plan for continued fluid intake and holding home BP meds. Dizziness is improving and pt worked well with therapies today.   Pt with good oral intake. Noted meal completions 50-100%.   No wt since last visit.   Per TOC notes, plan for SNF placement at discharge. Pt awaiting bed offers.   Labs reviewed: Na: 130.    Diet Order:   Diet Order             Diet regular Room service appropriate? Yes; Fluid consistency: Thin  Diet effective now                   EDUCATION NEEDS:   Education needs have been addressed  Skin:  Skin Assessment: Skin Integrity Issues: Skin Integrity Issues:: Incisions Incisions: closed lt thigh  Last BM:  12/16/23  Height:   Ht Readings from Last 1 Encounters:  12/11/23 5' (1.524 m)    Weight:   Wt Readings from Last 1 Encounters:  12/11/23 68 kg    Ideal Body Weight:  47.7 kg  BMI:  Body mass index is 29.28 kg/m.  Estimated  Nutritional Needs:   Kcal:  1650-1850  Protein:  85-100 g  Fluid:  > 1.5 L    Margery ORN, RD, LDN, CDCES Registered Dietitian III Certified Diabetes Care and Education Specialist If unable to reach this RD, please use RD Inpatient group chat on secure chat between hours of 8am-4 pm daily

## 2023-12-17 NOTE — Progress Notes (Signed)
 Physical Therapy Treatment Patient Details Name: Mary Buckley MRN: [id number] DOB: Feb 20, 1941 Today's Date: 12/17/2023   History of Present Illness Mary Buckley is a 83 y.o. 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 Found to have a left femoral shaft fracture now s/p IM nail 12/11/23.    PT Comments  Participated in exercises as described below.  She is able to get to EOB with min a x 1 then stand and walk to [facility] at end of bed, Stand to attend to her own care needs with 1 hand support of walker and then walk to recliner.  She is able to stand a second time for static standing and some LLE ex.  While she does endorse some Swimmy at end of session, her overall tolerance for activity is improved and is taking good quality steps for limited distances.  Remains in recliner after session.  Orthostatics not taken today as improvement in tolerance and symptoms is noted.   If plan is discharge home, recommend the following: A little help with walking and/or transfers;A little help with bathing/dressing/bathroom;Assistance with cooking/housework;Assist for transportation;Help with stairs or ramp for entrance   Can travel by private vehicle        Equipment Recommendations       Recommendations for Other Services       Precautions / Restrictions Precautions Precautions: Fall Precaution Comments: monitor BP Restrictions Weight Bearing Restrictions Per Provider Order: Yes LLE Weight Bearing Per Provider Order: Weight bearing as tolerated Other Position/Activity Restrictions: Orthostatic Hypotension     Mobility  Bed Mobility Overal bed mobility: Needs Assistance Bed Mobility: Supine to Sit     Supine to sit: Min assist       Patient Response: Cooperative  Transfers Overall transfer level: Needs  assistance Equipment used: Rolling walker (2 wheels) Transfers: Sit to/from Stand Sit to Stand: Min assist                Ambulation/Gait Ambulation/Gait assistance: Min Chemical Engineer (Feet): 6 Feet Assistive device: Rolling walker (2 wheels) Gait Pattern/deviations: Step-to pattern, Antalgic Gait velocity: decr     General Gait Details: able to walk to [facility] at end of bed then to recliner after use.  some swimmy but less than yesterday and able to tolerate increased activity   Stairs             Wheelchair Mobility     Tilt Bed Tilt Bed Patient Response: Cooperative  Modified Rankin (Stroke Patients Only)       Balance Overall balance assessment: Needs assistance Sitting-balance support: No upper extremity supported, Feet supported Sitting balance-Leahy Scale: Good     Standing balance support: Bilateral upper extremity supported, During functional activity, Reliant on assistive device for balance Standing balance-Leahy Scale: Fair Standing balance comment: able to attend to self care after use of BSC to void with 1 hand assist on walker                            Cognition Arousal: Alert Behavior During Therapy: [facility] for tasks assessed/performed Overall Cognitive Status: Within Functional Limits for tasks assessed  Exercises Other Exercises Other Exercises: supine AAROM and standing AROM    General Comments        Pertinent Vitals/Pain Pain Assessment Pain Assessment: Faces Faces Pain Scale: Hurts little more Pain Location: L knee and hip Pain Descriptors / Indicators: Discomfort, Grimacing, Operative site guarding Pain Intervention(s): Limited activity within patient's tolerance, Monitored during session, Repositioned    Home Living                          Prior Function            PT Goals (current goals can now be found in the care plan section)  Progress towards PT goals: Progressing toward goals    Frequency    7X/week      PT Plan      Co-evaluation              AM-PAC PT 6 Clicks Mobility   Outcome Measure  Help needed turning from your back to your side while in a flat bed without using bedrails?: A Little Help needed moving from lying on your back to sitting on the side of a flat bed without using bedrails?: A Little Help needed moving to and from a bed to a chair (including a wheelchair)?: A Little Help needed standing up from a chair using your arms (e.g., wheelchair or bedside chair)?: A Little Help needed to walk in hospital room?: A Little Help needed climbing 3-5 steps with a railing? : A Lot 6 Click Score: 17    End of Session Equipment Utilized During Treatment: Gait belt Activity Tolerance: Patient tolerated treatment well Patient left: in chair;with call bell/phone within reach;with chair alarm set Nurse Communication: Mobility status PT Visit Diagnosis: Muscle weakness (generalized) (M62.81);History of falling (Z91.81);Difficulty in walking, not elsewhere classified (R26.2);Pain Pain - Right/Left: Left Pain - part of body: Hip     Time: 0916-0930 PT Time Calculation (min) (ACUTE ONLY): 14 min  Charges:    $Gait Training: 8-22 mins PT General Charges $$ ACUTE PT VISIT: 1 Visit                   Lauraine Gills, PTA 12/17/23, 10:37 AM

## 2023-12-17 NOTE — Consult Note (Signed)
 Pharmacy Consult Note - Anticoagulation  Pharmacy Consult for warfarin Indication:  h/o portal vein thrombosis  PATIENT MEASUREMENTS: Height: 5' (152.4 cm) Weight: 68 kg (149 lb 14.6 oz) IBW/kg (Calculated) : 45.5 HEPARIN DW (KG): 60.2  VITAL SIGNS: Temp: 98.6 F (37 C) (02/04 0610) Temp Source: Oral (02/04 0610) BP: 115/50 (02/04 0610) Pulse Rate: 88 (02/04 0610)  Recent Labs    12/16/23 0415 12/17/23 0449  HGB 9.4*  --   LABPROT 22.3* 24.5*  INR 1.9* 2.2*  CREATININE 0.79  --     Estimated Creatinine Clearance: 46.6 mL/min (by C-G formula based on SCr of 0.79 mg/dL).  PAST MEDICAL HISTORY: Past Medical History:  Diagnosis Date   Arthritis    Atrophic vaginitis    Chronic kidney disease    Coronary artery disease    GERD (gastroesophageal reflux disease)    Hematuria    microscopic   Hepatitis    Hyperlipidemia    Hypertension    Kidney stone    Peripheral vascular disease (HCC)    Portal vein thrombosis    ASSESSMENT: 83 y.o. female with PMH including portal vein thrombosis is presenting with femoral fracture. Patient is on chronic anticoagulation with warfarin per chart review. Last dose unknown and patient currently in PACU. Her INR was slightly subtherapeutic at 1.9 upon presentation, and patient did receive vitamin K  2 mg IV x 1 prior to orthopedic surgery. Pharmacy has been consulted to initiate and manage heparin intravenous infusion.  Pertinent medications: Warfarin 2 mg Mon, Tues, Thurs, Fri Warfarin 1 mg Sun, Wed, Sat Total weekly warfarin = 11 mg  New drug-drug interactions: N/A  Goal(s) of therapy: INR 2 - 3 Monitor platelets by anticoagulation protocol: Yes   Baseline anticoagulation labs: Recent Labs    12/15/23 0406 12/16/23 0415 12/17/23 0449  INR 1.9* 1.9* 2.2*  HGB  --  9.4*  --    Date INR Warfarin Dose  1/29 1.7 3 mg 1/30 1.3 5 mg 1/31 1.7 3 mg 2/1 2.1 1 mg 2/2 1.9 3 mg 2/3 1.9 3.5 mg 2/4 2.2 2 mg    PLAN: INR is  therapeutic. Will give warfarin 2 mg x 1 tonight (home dose). Daily INR. CBC at least every 3 days. Enoxaparin  was started for DVT ppx due to subtherapeutic INR and recent INTRAMEDULLARY (IM) NAIL INTERTROCHANTERIC procedure while INR was subtherapeutic (discontinued on 2/3 by surgery).    Thank you for involving pharmacy in this patient's care.   Cathaleen GORMAN Blanch, PharmD 12/17/2023 8:45 AM

## 2023-12-18 DIAGNOSIS — S72302A Unspecified fracture of shaft of left femur, initial encounter for closed fracture: Secondary | ICD-10-CM | POA: Diagnosis not present

## 2023-12-18 LAB — PROTIME-INR
INR: 2.4 — ABNORMAL HIGH (ref 0.8–1.2)
Prothrombin Time: 26.5 s — ABNORMAL HIGH (ref 11.4–15.2)

## 2023-12-18 MED ORDER — VITAMIN D (ERGOCALCIFEROL) 50000 UNITS PO CAPS: 1.0000 | ORAL_CAPSULE | ORAL | Status: AC

## 2023-12-18 MED ORDER — CETIRIZINE HCL 10 MG PO TABS: 10.0000 mg | ORAL_TABLET | Freq: Every day | ORAL | Status: AC

## 2023-12-18 MED ORDER — BUTALBITAL-APAP-CAFFEINE 50-325-40 MG PO TABS
1.0000 | ORAL_TABLET | Freq: Four times a day (QID) | ORAL | Status: DC | PRN
  Administered 2023-12-18: 1 via ORAL
  Filled 2023-12-18: qty 1

## 2023-12-18 MED ORDER — CYCLOBENZAPRINE HCL 5 MG PO TABS: 5.0000 mg | ORAL_TABLET | Freq: Three times a day (TID) | ORAL | Status: AC

## 2023-12-18 MED ORDER — ROSUVASTATIN CALCIUM 5 MG PO TABS: 5.0000 mg | ORAL_TABLET | Freq: Two times a day (BID) | ORAL | Status: AC

## 2023-12-18 MED ORDER — WARFARIN SODIUM 1 MG PO TABS
1.0000 mg | ORAL_TABLET | Freq: Once | ORAL | Status: DC
  Filled 2023-12-18: qty 1

## 2023-12-18 MED ORDER — LIDOCAINE 5 % EX PTCH: 1.0000 | MEDICATED_PATCH | Freq: Two times a day (BID) | CUTANEOUS | Status: AC | PRN

## 2023-12-18 NOTE — Consult Note (Signed)
 Pharmacy Consult Note - Anticoagulation  Pharmacy Consult for warfarin Indication:  h/o portal vein thrombosis  PATIENT MEASUREMENTS: Height: 5' (152.4 cm) Weight: 68 kg (149 lb 14.6 oz) IBW/kg (Calculated) : 45.5 HEPARIN DW (KG): 60.2  VITAL SIGNS: Temp: 98 F (36.7 C) (02/04 2304) Temp Source: Oral (02/04 2304) BP: 119/60 (02/04 2304) Pulse Rate: 90 (02/04 2304)  Recent Labs    12/16/23 0415 12/17/23 0449 12/18/23 0417  HGB 9.4*  --   --   LABPROT 22.3*   < > 26.5*  INR 1.9*   < > 2.4*  CREATININE 0.79  --   --    < > = values in this interval not displayed.    Estimated Creatinine Clearance: 46.6 mL/min (by C-G formula based on SCr of 0.79 mg/dL).  PAST MEDICAL HISTORY: Past Medical History:  Diagnosis Date   Arthritis    Atrophic vaginitis    Chronic kidney disease    Coronary artery disease    GERD (gastroesophageal reflux disease)    Hematuria    microscopic   Hepatitis    Hyperlipidemia    Hypertension    Kidney stone    Peripheral vascular disease (HCC)    Portal vein thrombosis    ASSESSMENT: 83 y.o. female with PMH including portal vein thrombosis is presenting with femoral fracture. Patient is on chronic anticoagulation with warfarin per chart review. Last dose unknown and patient currently in PACU. Her INR was slightly subtherapeutic at 1.9 upon presentation, and patient did receive vitamin K  2 mg IV x 1 prior to orthopedic surgery. Pharmacy has been consulted to initiate and manage heparin intravenous infusion.  Pertinent medications: Warfarin 2 mg Mon, Tues, Thurs, Fri Warfarin 1 mg Sun, Wed, Sat Total weekly warfarin = 11 mg  New drug-drug interactions: N/A  Goal(s) of therapy: INR 2 - 3 Monitor platelets by anticoagulation protocol: Yes   Baseline anticoagulation labs: Recent Labs    12/16/23 0415 12/17/23 0449 12/18/23 0417  INR 1.9* 2.2* 2.4*  HGB 9.4*  --   --    Date INR Warfarin Dose  1/29 1.7 3 mg 1/30 1.3 5  mg 1/31 1.7 3 mg 2/1 2.1 1 mg 2/2 1.9 3 mg 2/3 1.9 3.5 mg 2/4 2.2 2 mg  2/5 2.4 1 mg   PLAN: INR is therapeutic. Will give warfarin 1 mg x 1 tonight (home dose). Daily INR. CBC at least every 3 days. Enoxaparin  was started for DVT ppx due to subtherapeutic INR and recent INTRAMEDULLARY (IM) NAIL INTERTROCHANTERIC procedure while INR was subtherapeutic (discontinued on 2/3 by surgery).    Thank you for involving pharmacy in this patient's care.   Lum VEAR Mania, PharmD 12/18/2023 7:15 AM

## 2023-12-18 NOTE — Plan of Care (Signed)
  Problem: Education: Goal: Knowledge of General Education information will improve Description: Including pain rating scale, medication(s)/side effects and non-pharmacologic comfort measures Outcome: Progressing   Problem: Health Behavior/Discharge Planning: Goal: Ability to manage health-related needs will improve Outcome: Adequate for Discharge   Problem: Clinical Measurements: Goal: Respiratory complications will improve Outcome: Adequate for Discharge Goal: Cardiovascular complication will be avoided Outcome: Adequate for Discharge   Problem: Activity: Goal: Risk for activity intolerance will decrease Outcome: Progressing

## 2023-12-18 NOTE — Progress Notes (Signed)
 Physical Therapy Treatment Patient Details Name: Mary Buckley MRN: [id number] DOB: 18-Jul-1941 Today's Date: 12/18/2023   History of Present Illness Mary Buckley is a 83 y.o. 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 Found to have a left femoral shaft fracture now s/p IM nail 12/11/23.    PT Comments  Pt remains A and O x 4. Agreeable to session. Endorses feeling a little better than I did last time I saw you. (~ 4 days prior). Pt is motivated and agreeable to OOB activity. BP at rest 130/58(79). Pt required min assist to exit bed and used sheet + BUE to assist LLE to EOB > floor. Pt sat EOB x ~ 5 minutes. BP 123/65(82). Stood and took steps towards recliner prior to c/o slightly whoozy/ just don't feel right. Pt does have increased sweating in standing. BP in standing 109/55(70). Overall pt is progressing but slowly due to hypotensive responses to activity. Requested MD or TED stocking/abdominal binder prior to Dcing to STR. Pt's supportive daughter present throughout session. Pt is planning to DC to rehab today.      If plan is discharge home, recommend the following: A little help with walking and/or transfers;A little help with bathing/dressing/bathroom;Assistance with cooking/housework;Assist for transportation;Help with stairs or ramp for entrance     Equipment Recommendations  Other (comment) (defer to next level of care)       Precautions / Restrictions Precautions Precautions: Fall Precaution Comments: monitor BP Restrictions Weight Bearing Restrictions Per Provider Order: Yes LLE Weight Bearing Per Provider Order: Weight bearing as tolerated Other Position/Activity Restrictions: Orthostatic Hypotension     Mobility  Bed Mobility Overal bed mobility: Needs Assistance Bed Mobility: Supine to  Sit  Supine to sit: Min assist  General bed mobility comments: pt used BUE to assist LLE to EOB/floor    Transfers Overall transfer level: Needs assistance Equipment used: Rolling walker (2 wheels) Transfers: Sit to/from Stand Sit to Stand: Contact guard assist, Supervision  General transfer comment: pt stood EOB from close to lowest bed height with CGA-min assist.    Ambulation/Gait Ambulation/Gait assistance: Contact guard assist Gait Distance (Feet): 6 Feet Assistive device: Rolling walker (2 wheels) Gait Pattern/deviations: Step-to pattern, Antalgic Gait velocity: decr General Gait Details: Pt was able to ambulate short distance but still endorses feeling whoozy/ not quite right.    Balance Overall balance assessment: Needs assistance Sitting-balance support: No upper extremity supported, Feet supported Sitting balance-Leahy Scale: Good     Standing balance support: Bilateral upper extremity supported, During functional activity, Reliant on assistive device for balance Standing balance-Leahy Scale: Fair     Cognition Arousal: Alert Behavior During Therapy: WFL for tasks assessed/performed Overall Cognitive Status: Within Functional Limits for tasks assessed    General Comments: Pt is A and O x 4               Pertinent Vitals/Pain Pain Assessment Pain Assessment: 0-10 Pain Score: 4  Pain Location: L knee and hip Pain Descriptors / Indicators: Discomfort, Grimacing, Operative site guarding Pain Intervention(s): Limited activity within patient's tolerance, Monitored during session, Premedicated before session, Repositioned     PT Goals (current goals can now be found in the care plan section) Acute Rehab PT Goals Patient Stated Goal: rehab then home Progress towards PT goals: Progressing toward goals    Frequency    7X/week  AM-PAC PT 6 Clicks Mobility   Outcome Measure  Help needed turning from your back to your side while in a flat bed  without using bedrails?: A Little Help needed moving from lying on your back to sitting on the side of a flat bed without using bedrails?: A Little Help needed moving to and from a bed to a chair (including a wheelchair)?: A Little Help needed standing up from a chair using your arms (e.g., wheelchair or bedside chair)?: A Little Help needed to walk in hospital room?: A Little Help needed climbing 3-5 steps with a railing? : A Little 6 Click Score: 18    End of Session   Activity Tolerance: Patient tolerated treatment well Patient left: in chair;with call bell/phone within reach;with chair alarm set Nurse Communication: Mobility status PT Visit Diagnosis: Muscle weakness (generalized) (M62.81);History of falling (Z91.81);Difficulty in walking, not elsewhere classified (R26.2);Pain Pain - Right/Left: Left Pain - part of body: Hip     Time: 9090-9074 PT Time Calculation (min) (ACUTE ONLY): 16 min  Charges:    $Therapeutic Activity: 8-22 mins PT General Charges $$ ACUTE PT VISIT: 1 Visit                     Rankin Essex PTA 12/18/23, 10:06 AM

## 2023-12-18 NOTE — Progress Notes (Signed)
Report called to [facility] 

## 2023-12-18 NOTE — Progress Notes (Signed)
 Provider messaged r/t headache 5/10 and no PRN for moderate pain/headache. PRN meds added; see orders and MAR.

## 2023-12-18 NOTE — Discharge Summary (Addendum)
 Physician Discharge Summary  Mary Buckley FMW:[id number] DOB: 22-Jan-1941 DOA: 12/10/2023  PCP: Auston Reyes BIRCH, MD  Admit date: 12/10/2023 Discharge date: 12/18/2023  Admitted From: Home Disposition:  SNF  Recommendations for Outpatient Follow-up:  Follow up with PCP in 1-2 weeks Follow up orthopedics 2 weeks  Home Health:N  Equipment/Devices:None   Discharge Condition:Stable  CODE STATUS:FULL  Diet recommendation: Reg  Brief/Interim Summary: 83 y.o. 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   Patient states that she accidentally fell when she was walking at home.  Denies loss of consciousness.  No head or neck injury.  She developed pain in left upper leg and hip area, which is constant, sharp, severe, nonradiating, aggravated by movement.  No chest pain, cough, SOB.  No nausea, vomiting, diarrhea or abdominal pain.  No symptoms of UTI.      Discharge Diagnoses:  Principal Problem:   Femur fracture, left (HCC) Active Problems:   Fall   Deep vein thrombosis of portal vein   Benign essential HTN   HLD (hyperlipidemia)   Chronic diastolic CHF (congestive heart failure) (HCC)   Chronic kidney disease, stage 3a (HCC)   Overweight (BMI 25.0-29.9)   Hyponatremia   Orthostatic hypotension  Mary Buckley is a 83 y.o. 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   Patient states that she accidentally fell when she was walking at home.    Acute left femoral shaft fracture -After fall, s/p operative repair with nail on 1/29 with Dr. Lorelle. Hgb stable post-op - PT/OT, pain control, bowel regimen   - Stable for  DC - Follow up outpatient ortho 2 weeks    Orthostasis--improving -- received IV fluids --holding home bystolic /imdur /valsartan since bp still on the sfter side -- encourage oral fluid intake - Abd binder and TED hose ordered on dc - Follow up outpatient PCP 2 weeks   Portal vein thrombosis On coumadin  at home, reversed prior to surgery. - INR therapeutic at time of DC - Consider referral to outpatient heme to discuss indication for continued [facility] - Patients PVT was in 2006   Hypoxia --Resolved, cxr clear   Osteoporosis --On prolia outpt, follows w/ endo --will need endocrinology f/u    HTN Orthostatic as above - hold home imdur , bisoprolol, valsartan    Hyponatremia --improving    Chronic pain - home gabapentin   Discharge Instructions  Discharge Instructions     Activity order   Complete by: As directed    OK to shower in seat   Diet general   Complete by: As directed    Increase activity slowly   Complete by: As directed    Increase activity slowly   Complete by: As directed       Allergies as of 12/18/2023       Reactions   Amlodipine    Avelox [moxifloxacin Hcl In Nacl]    Bextra [valdecoxib]    Chlorzoxazone    Fosamax [alendronate Sodium]    Guaifenesin & Derivatives    Lodine [etodolac]    Methocarbamol Other (See Comments)   GI upset   Penicillins    Prednisone    Other reaction(s): Other (See Comments) flushing        Medication  List     PAUSE taking these medications    isosorbide  mononitrate 30 MG 24 hr tablet Wait to take this until your doctor or other care provider tells you to start again. Commonly known as: IMDUR  Take 15 mg by mouth daily.   nebivolol  10 MG tablet Wait to take this until your doctor or other care provider tells you to start again. Commonly known as: BYSTOLIC  Take by mouth.   valsartan 320 MG tablet Wait to take this until your doctor or other care provider tells you to start again. Commonly known as:  DIOVAN Take 320 mg by mouth daily.       STOP taking these medications    loratadine 10 MG tablet Commonly known as: CLARITIN   losartan 50 MG tablet Commonly known as: COZAAR       TAKE these medications    acetaminophen  325 MG tablet Commonly known as: TYLENOL  Take 650 mg by mouth every 8 (eight) hours as needed.   CALCIUM  PO Take by mouth.   celecoxib 200 MG capsule Commonly known as: CELEBREX Take 200 mg by mouth daily.   cetirizine  10 MG tablet Commonly known as: ZyrTEC  Allergy Take 1 tablet (10 mg total) by mouth daily.   cyanocobalamin  1000 MCG tablet Take 100 mcg by mouth daily.   cyclobenzaprine  5 MG tablet Commonly known as: FLEXERIL  Take 1 tablet (5 mg total) by mouth 3 (three) times daily. What changed:  medication strength how much to take when to take this   Dialyvite Vitamin D  5000 125 MCG (5000 UT) capsule Generic drug: Cholecalciferol  Take 5,000 Units by mouth daily.   gabapentin  100 MG capsule Commonly known as: NEURONTIN  Take 100 mg by mouth 3 (three) times daily.   lidocaine  5 % Commonly known as: Lidoderm  Place 1 patch onto the skin every 12 (twelve) hours as needed. Remove & Discard patch within 12 hours or as directed by MD   omega-3 acid ethyl esters 1 g capsule Commonly known as: LOVAZA  Take 1 g by mouth 2 (two) times daily.   oxyCODONE -acetaminophen  5-325 MG tablet Commonly known as: PERCOCET/ROXICET Take 1 tablet by mouth every 4 (four) hours as needed for moderate pain (pain score 4-6).   pantoprazole  40 MG tablet Commonly known as: PROTONIX  Take 40 mg by mouth daily.   Propylene Glycol 0.6 % Soln Place 1 drop into both eyes daily.   rosuvastatin  5 MG tablet Commonly known as: CRESTOR  Take 1 tablet (5 mg total) by mouth 2 (two) times daily. What changed: when to take this   Vitamin D  (Ergocalciferol ) 50000 units Caps Take 1 capsule by mouth See admin instructions. Patient takes every OTHER WEEK What changed:   when to take this additional instructions   warfarin 2 MG tablet Commonly known as: COUMADIN  Take 2 mg by mouth daily. 1mg , Sun, Wed, Sat 2mg , Mon, Tues, Thur, Fri        Contact information for follow-up providers     Charlene Debby BROCKS, PA-C. Schedule an appointment as soon as possible for a visit in 2 week(s).   Specialties: Orthopedic Surgery, Emergency Medicine Contact information: [address] [place] [address] [phone]         Auston Reyes BIRCH, MD. Schedule an appointment as soon as possible for a visit in 2 week(s).   Specialty: Internal Medicine Contact information: [address] [place] [address] [phone]  Contact information for after-discharge care     Destination     [facility] Preferred SNF .   Service: Skilled Nursing Contact information: [address] [place]  [address] [phone]                    Allergies  Allergen Reactions   Amlodipine    Avelox [Moxifloxacin Hcl In Nacl]    Bextra [Valdecoxib]    Chlorzoxazone    Fosamax [Alendronate Sodium]    Guaifenesin & Derivatives    Lodine [Etodolac]    Methocarbamol Other (See Comments)    GI upset   Penicillins    Prednisone     Other reaction(s): Other (See Comments) flushing    Consultations: Ortho   Procedures/Studies: DG HIP UNILAT WITH PELVIS 2-3 VIEWS LEFT Result Date: 12/11/2023 CLINICAL DATA:  Elective surgery.  Intramedullary nail. EXAM: DG HIP (WITH OR WITHOUT PELVIS) 2-3V LEFT COMPARISON:  Left femur radiographs 12/10/2023 FINDINGS: Images were performed intraoperatively without the presence of a radiologist. New long intramedullary nail fixation of the previously seen complete transverse fracture of the proximal femoral diaphysis. Improved, now normal alignment. No hardware complication is seen. Total  fluoroscopy images: 8 Total fluoroscopy time: 81 seconds Total dose: Radiation Exposure Index (as provided by the fluoroscopic device): 29.89 mGy air Kerma Please see intraoperative findings for further detail. IMPRESSION: Intraoperative fluoroscopy for proximal femoral diaphysis fracture fixation. Electronically Signed   By: Tanda Lyons M.D.   On: 12/11/2023 18:41   DG Chest Port 1 View Result Date: 12/11/2023 CLINICAL DATA:  Hypoxia.  Preop. EXAM: PORTABLE CHEST 1 VIEW COMPARISON:  CT 03/02/2023 FINDINGS: The heart is normal in size. Aortic atherosclerosis and tortuosity. Otherwise normal mediastinal contours. Minor left lung base atelectasis. No pulmonary edema, pleural effusion, or pneumothorax. IMPRESSION: Minor left lung base atelectasis. Electronically Signed   By: Andrea Gasman M.D.   On: 12/11/2023 17:44   DG C-Arm 1-60 Min-No Report Result Date: 12/11/2023 Fluoroscopy was utilized by the requesting physician.  No radiographic interpretation.   DG C-Arm 1-60 Min-No Report Result Date: 12/11/2023 Fluoroscopy was utilized by the requesting physician.  No radiographic interpretation.   CT HEAD WO CONTRAST ( ) Result Date: 12/11/2023 CLINICAL DATA:  Head trauma, minor (Age >= 65y) fall (lowered to ground) resulting in L leg shortening with lateral rotation - pulses and cap refill intact at this time. When being lowered to the ground pt heard pop. Did not hit head. Takes coumadin . EXAM: CT HEAD WITHOUT CONTRAST TECHNIQUE: Contiguous axial images were obtained from the base of the skull through the vertex without intravenous contrast. RADIATION DOSE REDUCTION: This exam was performed according to the departmental dose-optimization program which includes automated exposure control, adjustment of the mA and/or kV according to patient size and/or use of iterative reconstruction technique. COMPARISON:  CT head 03/02/2023. FINDINGS: Brain: No evidence of large-territorial acute infarction. No  parenchymal hemorrhage. No mass lesion. No extra-axial collection. No mass effect or midline shift. No hydrocephalus. Basilar cisterns are patent. Vascular: No hyperdense vessel. Atherosclerotic calcifications are present within the cavernous internal carotid and vertebral arteries. Skull: No acute fracture or focal lesion. Sinuses/Orbits: Right sphenoid sinus mucosal thickening and chronic retention cyst. Otherwise paranasal sinuses and mastoid air cells are clear. The orbits are unremarkable. Other: None. IMPRESSION: No acute intracranial abnormality. Electronically Signed   By: Morgane  Naveau M.D.   On: 12/11/2023 00:39   CT FEMUR LEFT  WO CONTRAST Result Date: 12/10/2023 CLINICAL DATA:  Left femur fracture EXAM: CT OF THE LOWER LEFT EXTREMITY WITHOUT CONTRAST TECHNIQUE: Multidetector CT imaging of the lower left extremity was performed according to the standard protocol. RADIATION DOSE REDUCTION: This exam was performed according to the departmental dose-optimization program which includes automated exposure control, adjustment of the mA and/or kV according to patient size and/or use of iterative reconstruction technique. COMPARISON:  Left femur x-ray 12/10/2023 FINDINGS: Bones/Joint/Cartilage There is an acute transverse fracture through the proximal 1/3 of the femoral diaphysis. The distal fracture fragment is rotated externally in displaced posterior medially to the proximal fracture fragment 1.5 shaft width. There is 5 cm of overlap. There is no dislocation. No significant knee or hip joint effusion identified. Ligaments Suboptimally assessed by CT. Muscles and Tendons There is intramuscular edema surrounding the fracture site. No focal hematoma identified. Soft tissues Peripheral vascular calcifications are present. No focal hematoma or foreign body. Varicose veins are seen in the proximal calf. Sigmoid colon diverticula are present. IMPRESSION: Acute displaced and rotated fracture of the proximal  femoral diaphysis. Electronically Signed   By: Greig Pique M.D.   On: 12/10/2023 21:11   DG Femur Min 2 Views Left Result Date: 12/10/2023 CLINICAL DATA:  Recent fall with left thigh deformity and foreshortening, initial encounter EXAM: LEFT FEMUR 2 VIEWS COMPARISON:  None Available. FINDINGS: Mid to distal diaphyseal fracture is noted in the left femur. Bony overlap is noted as previously described. No other focal abnormality is seen. IMPRESSION: Left femoral fracture with bony overlap. Electronically Signed   By: Oneil Devonshire M.D.   On: 12/10/2023 20:15   DG Hip Unilat W or Wo Pelvis 2-3 Views Left Result Date: 12/10/2023 CLINICAL DATA:  Recent fall with left hip pain, initial encounter EXAM: DG HIP (WITH OR WITHOUT PELVIS) 3V LEFT COMPARISON:  None Available. FINDINGS: Pelvic ring is intact. Left femoral head is well seated. There is a mildly oblique fracture through the proximal to mid femoral shaft with proximally 4 cm of bony overlap at the fracture site. IMPRESSION: Left femoral diaphyseal fracture with significant bony overlap at the fracture site. Electronically Signed   By: Oneil Devonshire M.D.   On: 12/10/2023 20:14      Subjective: Seen and examined on day of dc.  Stable, appropriate for dispo to SNF  Discharge Exam: Vitals:   12/17/23 2304 12/18/23 0901  BP: 119/60 (!) 130/58  Pulse: 90 93  Resp: 18 18  Temp: 98 F (36.7 C) 98.6 F (37 C)  SpO2: 96%    Vitals:   12/17/23 0610 12/17/23 1700 12/17/23 2304 12/18/23 0901  BP: (!) 115/50 136/61 119/60 (!) 130/58  Pulse: 88 84 90 93  Resp: 18 18 18 18   Temp: 98.6 F (37 C) 98.6 F (37 C) 98 F (36.7 C) 98.6 F (37 C)  TempSrc: Oral Oral Oral Oral  SpO2: 97% 99% 96%   Weight:      Height:        General: Pt is alert, awake, not in acute distress Cardiovascular: RRR, S1/S2 +, no rubs, no gallops Respiratory: CTA bilaterally, no wheezing, no rhonchi Abdominal: Soft, NT, ND, bowel sounds + Extremities: no edema, no  cyanosis    The results of significant diagnostics from this hospitalization (including imaging, microbiology, ancillary and laboratory) are listed below for reference.     Microbiology: No results found for this or any previous visit (from the past 240 hours).   Labs: BNP (last 3  results) Recent Labs    12/10/23 2123  BNP 55.9   Basic Metabolic Panel: Recent Labs  Lab 12/12/23 0410 12/13/23 0722 12/14/23 0439 12/16/23 0415  NA 134* 136 129* 130*  K 4.2 4.5 4.5 4.7  CL 100 99 97* 98  CO2 26 26 24 25   GLUCOSE 116* 115* 113* 130*  BUN 13 13 14 16   CREATININE 1.05* 0.82 0.72 0.79  CALCIUM  8.4* 8.3* 8.0* 8.5*   Liver Function Tests: No results for input(s): AST, ALT, ALKPHOS, BILITOT, PROT, ALBUMIN in the last 168 hours. No results for input(s): LIPASE, AMYLASE in the last 168 hours. No results for input(s): AMMONIA in the last 168 hours. CBC: Recent Labs  Lab 12/12/23 0410 12/13/23 0722 12/14/23 0439 12/16/23 0415  WBC 6.2 5.8 6.5  --   HGB 9.4* 10.0* 9.6* 9.4*  HCT 27.7* 30.0* 28.0*  --   MCV 97.5 98.4 97.2  --   PLT 208 228 248  --    Cardiac Enzymes: No results for input(s): CKTOTAL, CKMB, CKMBINDEX, TROPONINI in the last 168 hours. BNP: Invalid input(s): POCBNP CBG: No results for input(s): GLUCAP in the last 168 hours. D-Dimer No results for input(s): DDIMER in the last 72 hours. Hgb A1c No results for input(s): HGBA1C in the last 72 hours. Lipid Profile No results for input(s): CHOL, HDL, LDLCALC, TRIG, CHOLHDL, LDLDIRECT in the last 72 hours. Thyroid function studies No results for input(s): TSH, T4TOTAL, T3FREE, THYROIDAB in the last 72 hours.  Invalid input(s): FREET3 Anemia work up No results for input(s): VITAMINB12, FOLATE, FERRITIN, TIBC, IRON, RETICCTPCT in the last 72 hours. Urinalysis    Component Value Date/Time   COLORURINE STRAW (A) 03/02/2023 0726   APPEARANCEUR  CLEAR (A) 03/02/2023 0726   APPEARANCEUR Clear 01/01/2023 1333   LABSPEC 1.015 03/02/2023 0726   LABSPEC 1.003 10/01/2014 0607   PHURINE 6.0 03/02/2023 0726   GLUCOSEU NEGATIVE 03/02/2023 0726   GLUCOSEU Negative 10/01/2014 0607   HGBUR MODERATE (A) 03/02/2023 0726   BILIRUBINUR NEGATIVE 03/02/2023 0726   BILIRUBINUR Negative 01/01/2023 1333   BILIRUBINUR Negative 10/01/2014 0607   KETONESUR NEGATIVE 03/02/2023 0726   PROTEINUR NEGATIVE 03/02/2023 0726   NITRITE NEGATIVE 03/02/2023 0726   LEUKOCYTESUR NEGATIVE 03/02/2023 0726   LEUKOCYTESUR Trace 10/01/2014 0607   Sepsis Labs Recent Labs  Lab 12/12/23 0410 12/13/23 0722 12/14/23 0439  WBC 6.2 5.8 6.5   Microbiology No results found for this or any previous visit (from the past 240 hours).   Time coordinating discharge: Over 30 minutes  SIGNED:   Calvin KATHEE Robson, MD  [facility] 12/18/2023, 12:20 PM Pager   If 7PM-7AM, please contact night-coverage

## 2023-12-18 NOTE — TOC Transition Note (Signed)
 Transition of Care [facility]) - Discharge Note   Patient Details  Name: Mary Buckley MRN: [id number] Date of Birth: [birth year]  [facility] CM/SW Contact:  Mary Thakur A Eugenie Harewood, RN Phone Number: 12/18/2023, 2:04 PM   Clinical Narrative:    Chart reviewed.  I have spoken Mary Buckley with Mary Buckley reports that he will have  bed for patient today.  Mary Buckley reports that patient will go to room 508 and the number to call report will be [phone].  I have sent [facility] patient SNF Transfer Report, Discharge Summary, and Discharge orders.   I have informed patient's daughter Mary Buckley of the above information.  Mary Buckley has also spoken with provider about Mary Buckley's medications.    I have arranged for [facility] to transport patient to the facility today.    I have informed staff nurse of the above information.       Final next level of care: Skilled Nursing Facility Barriers to Discharge: No Barriers Identified   Patient Goals and CMS Choice   CMS Medicare.gov Compare Post Acute Care list provided to::  (Mrs. Mary Buckley patient's daughter) Choice offered to / list presented to : Adult Children      Discharge Placement              Patient chooses bed at: [facility] Patient to be transferred to facility by: [facility] Name of family member notified: Mary Buckley ( patient's daughter ) Patient and family notified of of transfer: 12/18/23  Discharge Plan and Services Additional resources added to the After Visit Summary for                                       Social Drivers of Health (SDOH) Interventions SDOH Screenings   Food Insecurity: No Food Insecurity (12/11/2023)  Housing: Patient Declined (12/11/2023)  Transportation Needs: Patient Declined (12/11/2023)  Utilities: Patient Declined (12/11/2023)  Social Connections: Unknown (12/11/2023)  Tobacco Use: Low Risk  (12/11/2023)     Readmission Risk Interventions     No data to  display

## 2023-12-18 NOTE — Progress Notes (Signed)
 Attempted to call report at this time; no answer. Called back and left name and unit phone number for someone to return call for report.

## 2024-03-11 ENCOUNTER — Ambulatory Visit
Admission: RE | Admit: 2024-03-11 | Discharge: 2024-03-11 | Disposition: A | Discharge status: Home / Self Care | Payer: Medicare Other | Source: Ambulatory Visit | Attending: Internal Medicine | Admitting: Internal Medicine

## 2024-03-11 DIAGNOSIS — Z1231 Encounter for screening mammogram for malignant neoplasm of breast: Secondary | ICD-10-CM | POA: Diagnosis present
